# Patient Record
Sex: Female | Born: 1990
Health system: Southern US, Community
[De-identification: ages and names within clinical notes are randomized; demographics above are authoritative.]

## PROBLEM LIST (undated history)

## (undated) ENCOUNTER — Inpatient Hospital Stay (HOSPITAL_COMMUNITY): Payer: Self-pay

## (undated) DIAGNOSIS — F329 Major depressive disorder, single episode, unspecified: Secondary | ICD-10-CM

## (undated) DIAGNOSIS — F909 Attention-deficit hyperactivity disorder, unspecified type: Secondary | ICD-10-CM

## (undated) DIAGNOSIS — F419 Anxiety disorder, unspecified: Secondary | ICD-10-CM

## (undated) DIAGNOSIS — F32A Depression, unspecified: Secondary | ICD-10-CM

## (undated) DIAGNOSIS — D649 Anemia, unspecified: Secondary | ICD-10-CM

## (undated) HISTORY — DX: Anxiety disorder, unspecified: F41.9

## (undated) HISTORY — PX: LEEP: SHX91

## (undated) HISTORY — DX: Anemia, unspecified: D64.9

## (undated) HISTORY — DX: Depression, unspecified: F32.A

## (undated) HISTORY — DX: Major depressive disorder, single episode, unspecified: F32.9

---

## 2014-03-08 ENCOUNTER — Ambulatory Visit (INDEPENDENT_AMBULATORY_CARE_PROVIDER_SITE_OTHER): Payer: BC Managed Care – PPO | Admitting: Physician Assistant

## 2014-03-08 VITALS — BP 110/64 | HR 115 | Temp 100.5°F | Resp 18 | Ht 64.5 in | Wt 110.0 lb

## 2014-03-08 DIAGNOSIS — J029 Acute pharyngitis, unspecified: Secondary | ICD-10-CM

## 2014-03-08 DIAGNOSIS — R509 Fever, unspecified: Secondary | ICD-10-CM

## 2014-03-08 DIAGNOSIS — J020 Streptococcal pharyngitis: Secondary | ICD-10-CM

## 2014-03-08 LAB — POCT RAPID STREP A (OFFICE): Rapid Strep A Screen: POSITIVE — AB

## 2014-03-08 LAB — POCT CBC
Granulocyte percent: 81.1 %G — AB (ref 37–80)
HCT, POC: 30.6 % — AB (ref 37.7–47.9)
Hemoglobin: 9.4 g/dL — AB (ref 12.2–16.2)
Lymph, poc: 1.5 (ref 0.6–3.4)
MCH, POC: 24.6 pg — AB (ref 27–31.2)
MCHC: 30.7 g/dL — AB (ref 31.8–35.4)
MCV: 80.2 fL (ref 80–97)
MID (cbc): 0.9 (ref 0–0.9)
MPV: 8.9 fL (ref 0–99.8)
POC Granulocyte: 10.3 — AB (ref 2–6.9)
POC LYMPH PERCENT: 11.8 %L (ref 10–50)
POC MID %: 7 %M (ref 0–12)
Platelet Count, POC: 283 10*3/uL (ref 142–424)
RBC: 3.82 M/uL — AB (ref 4.04–5.48)
RDW, POC: 18.1 %
WBC: 12.7 10*3/uL — AB (ref 4.6–10.2)

## 2014-03-08 MED ORDER — HYDROCODONE-ACETAMINOPHEN 7.5-325 MG/15ML PO SOLN: 10.0000 mL | Freq: Three times a day (TID) | ORAL | Status: DC | PRN

## 2014-03-08 MED ORDER — AZITHROMYCIN 500 MG PO TABS: 500.0000 mg | ORAL_TABLET | Freq: Every day | ORAL | Status: DC

## 2014-03-08 MED ORDER — IBUPROFEN 800 MG PO TABS: 800.0000 mg | ORAL_TABLET | Freq: Three times a day (TID) | ORAL | Status: DC | PRN

## 2014-03-08 NOTE — Patient Instructions (Signed)
Take second dose of azithromycin 250 mg tonight. Starting tomorrow take 2 pills x 1 day. Pick up prescription for 500 mg tablets and take 1 daily x 2 days.  Take ibuprofen 800 mg three times daily as needed for fever and pain Lortab elixir every 8 hours as needed for pain - caution sedation Increase fluids and rest Recheck tomorrow afternoon before 3:00 p.m

## 2014-03-08 NOTE — Progress Notes (Signed)
Subjective:    Patient ID: Misty SpeckingChristelle Kldibu, female    DOB: 02/12/1991, 23 y.o.   MRN: [id number]  HPI 23 year old female presents for evaluation of sore throat x 5 days. States symptoms started suddenly and have continued to worsen. She has had fever (max T: 101), sore throat, painful swallowing, headache, body aches, and decreased appetite.  She is a Consulting civil engineerstudent at [employer] and she went to her student health center yesterday and was prescribed a Zpack. Reports that they checked her for strep, flu, and mono and all were negative.  Has taken the first 2 days of the Zpack and feels worse.  She has taken ibuprofen 400 mg which does help temporarily with the pain and fever. She has been unable to eat anything secondary to painful swallowing.  No known hx of strep.  Patient is otherwise healthy with no other concerns today.  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      Review of Systems  Constitutional: Positive for fever, chills and fatigue.  HENT: Positive for sore throat and trouble swallowing (secondary to pain). Negative for congestion, postnasal drip, rhinorrhea and sinus pressure.   Respiratory: Negative for cough, shortness of breath and wheezing.   Gastrointestinal: Positive for nausea. Negative for vomiting and abdominal pain.  Neurological: Positive for headaches. Negative for dizziness.       Objective:   Physical Exam  Constitutional: She is oriented to person, place, and time. She appears well-developed and well-nourished.  HENT:  Head: Normocephalic and atraumatic.  Right Ear: Hearing, tympanic membrane, external ear and ear canal normal.  Left Ear: Hearing, tympanic membrane, external ear and ear canal normal.  Mouth/Throat: Uvula is midline and mucous membranes are normal. Posterior oropharyngeal erythema (3+ tonsillar swelling) present. No oropharyngeal exudate, posterior oropharyngeal edema or tonsillar abscesses.  Eyes: Conjunctivae are normal.  Neck:  Normal range of motion. Neck supple.  Cardiovascular: Normal rate, regular rhythm and normal heart sounds.   Pulmonary/Chest: Effort normal and breath sounds normal.  Lymphadenopathy:    She has cervical adenopathy.  Neurological: She is alert and oriented to person, place, and time.  Psychiatric: She has a normal mood and affect. Her behavior is normal. Judgment and thought content normal.     Results for orders placed in visit on 03/08/14  POCT CBC      Result Value Ref Range   WBC 12.7 (*) 4.6 - 10.2 K/uL   Lymph, poc 1.5  0.6 - 3.4   POC LYMPH PERCENT 11.8  10 - 50 %L   MID (cbc) 0.9  0 - 0.9   POC MID % 7.0  0 - 12 %M   POC Granulocyte 10.3 (*) 2 - 6.9   Granulocyte percent 81.1 (*) 37 - 80 %G   RBC 3.82 (*) 4.04 - 5.48 M/uL   Hemoglobin 9.4 (*) 12.2 - 16.2 g/dL   HCT, POC 32.430.6 (*) 40.137.7 - 47.9 %   MCV 80.2  80 - 97 fL   MCH, POC 24.6 (*) 27 - 31.2 pg   MCHC 30.7 (*) 31.8 - 35.4 g/dL   RDW, POC 02.718.1     Platelet Count, POC 283  142 - 424 K/uL   MPV 8.9  0 - 99.8 fL  POCT RAPID STREP A (OFFICE)      Result Value Ref Range   Rapid Strep A Screen Positive (*) Negative        Assessment & Plan:  Strep pharyngitis - Plan: azithromycin (ZITHROMAX) 500 MG tablet  Acute pharyngitis - Plan: POCT CBC, POCT rapid strep A, Epstein-Barr virus VCA antibody panel, ibuprofen (ADVIL,MOTRIN) 800 MG tablet, HYDROcodone-acetaminophen (HYCET) 7.5-325 mg/15 ml solution  Fever, unspecified  Increase Zpack to 500 mg daily x 5 days. Instructed her to take another dose of her 250 mg tablets tonight, then take 2 tablets tomorrow. I have provided a new rx for zithromax 500 mg to complete her 5 day course.  Lortab elixier q8hours prn pain. Rx for ibuprofen 800 mg tid prn pain.  Push fluids and try to eat once pain is controlled Recheck tomorrow afternoon. If still no improvement may consider change to clinda.  To ER if acutely worse in the night.

## 2014-03-09 ENCOUNTER — Encounter: Payer: Self-pay | Admitting: Physician Assistant

## 2014-03-09 ENCOUNTER — Ambulatory Visit (INDEPENDENT_AMBULATORY_CARE_PROVIDER_SITE_OTHER): Payer: BC Managed Care – PPO | Admitting: Physician Assistant

## 2014-03-09 VITALS — BP 118/64 | HR 99 | Temp 98.0°F | Resp 17 | Ht 64.5 in | Wt 109.0 lb

## 2014-03-09 DIAGNOSIS — B370 Candidal stomatitis: Secondary | ICD-10-CM

## 2014-03-09 DIAGNOSIS — J020 Streptococcal pharyngitis: Secondary | ICD-10-CM

## 2014-03-09 DIAGNOSIS — J029 Acute pharyngitis, unspecified: Secondary | ICD-10-CM

## 2014-03-09 LAB — POCT CBC
Granulocyte percent: 79.4 %G (ref 37–80)
HCT, POC: 33.2 % — AB (ref 37.7–47.9)
Hemoglobin: 10 g/dL — AB (ref 12.2–16.2)
Lymph, poc: 1.4 (ref 0.6–3.4)
MCH, POC: 24.7 pg — AB (ref 27–31.2)
MCHC: 30.1 g/dL — AB (ref 31.8–35.4)
MCV: 81.9 fL (ref 80–97)
MID (cbc): 0.7 (ref 0–0.9)
MPV: 9.5 fL (ref 0–99.8)
POC Granulocyte: 7.9 — AB (ref 2–6.9)
POC LYMPH PERCENT: 13.7 %L (ref 10–50)
POC MID %: 6.9 %M (ref 0–12)
Platelet Count, POC: 334 10*3/uL (ref 142–424)
RBC: 4.05 M/uL (ref 4.04–5.48)
RDW, POC: 17.9 %
WBC: 9.9 10*3/uL (ref 4.6–10.2)

## 2014-03-09 LAB — POCT SKIN KOH: Skin KOH, POC: POSITIVE

## 2014-03-09 MED ORDER — CLOTRIMAZOLE 10 MG MT TROC: 10.0000 mg | Freq: Every day | OROMUCOSAL | Status: DC

## 2014-03-09 NOTE — Progress Notes (Signed)
   Subjective:    Patient ID: Misty Ayers, female    DOB: 12/10/1990, 23 y.o.   MRN: [id number]  HPI 23 year old female presents for recheck of strep throat. Was seen last night and had + rapid strep and WBC count of 12.7. Was having fever up to 101 and a difficult time swallowing. Today reports significant improvement in symptoms. Fever has broken and pain has improved. She has been able to eat and drink normally today.   Complains also of pain on her tongue and has noticed "white stuff" on the surface when she woke up this morning.   She is otherwise doing much better today.     Review of Systems  Constitutional: Negative for fever and chills.  HENT: Positive for sore throat. Negative for congestion and trouble swallowing.   Respiratory: Negative for cough.   Neurological: Negative for headaches.       Objective:   Physical Exam  Constitutional: She is oriented to person, place, and time. She appears well-developed and well-nourished.  HENT:  Head: Normocephalic and atraumatic.  Right Ear: Hearing, tympanic membrane, external ear and ear canal normal.  Left Ear: Hearing, tympanic membrane, external ear and ear canal normal.  Mouth/Throat: Uvula is midline. Posterior oropharyngeal erythema (2+ tonsillar swelling) present. No oropharyngeal exudate, posterior oropharyngeal edema or tonsillar abscesses.  There is a thin white film on tongue  Eyes: Conjunctivae are normal.  Neck: Normal range of motion.  Cardiovascular: Normal rate, regular rhythm and normal heart sounds.   Pulmonary/Chest: Effort normal and breath sounds normal.  Neurological: She is alert and oriented to person, place, and time.  Psychiatric: She has a normal mood and affect. Her behavior is normal. Judgment and thought content normal.      Results for orders placed in visit on 03/09/14  POCT SKIN KOH      Result Value Ref Range   Skin KOH, POC Positive    POCT CBC      Result Value Ref Range   WBC 9.9   4.6 - 10.2 K/uL   Lymph, poc 1.4  0.6 - 3.4   POC LYMPH PERCENT 13.7  10 - 50 %L   MID (cbc) 0.7  0 - 0.9   POC MID % 6.9  0 - 12 %M   POC Granulocyte 7.9 (*) 2 - 6.9   Granulocyte percent 79.4  37 - 80 %G   RBC 4.05  4.04 - 5.48 M/uL   Hemoglobin 10.0 (*) 12.2 - 16.2 g/dL   HCT, POC 40.933.2 (*) 81.137.7 - 47.9 %   MCV 81.9  80 - 97 fL   MCH, POC 24.7 (*) 27 - 31.2 pg   MCHC 30.1 (*) 31.8 - 35.4 g/dL   RDW, POC 91.417.9     Platelet Count, POC 334  142 - 424 K/uL   MPV 9.5  0 - 99.8 fL       Assessment & Plan:  Strep pharyngitis - Plan: POCT CBC  Acute pharyngitis - Plan: POCT Skin KOH  Oral thrush - Plan: clotrimazole (MYCELEX) 10 MG troche  Continue treatment of strep as directed. May take lortab elixir and ibuprofen 800 mg as prescribed until pain resolved.  Treat oral thrush with clotrimazole troche's 5x/day x 2 weeks RTC precautions discussed. Change toothbrush Follow up if symptoms worsening or fail to improve.

## 2014-03-10 LAB — EPSTEIN-BARR VIRUS VCA ANTIBODY PANEL
EBV EA IgG: <5 U/mL (ref <9.0)
EBV NA IgG: 518 U/mL — ABNORMAL HIGH (ref <18.0)
EBV VCA IgG: 341 U/mL — ABNORMAL HIGH (ref <18.0)
EBV VCA IgM: <10 U/mL (ref <36.0)

## 2014-03-13 NOTE — Progress Notes (Addendum)
Discussed with Misty Ayers recently had HIV testing and declined repeat testing at her current visit.  She was encouraged to have repeat testing soon

## 2014-03-20 ENCOUNTER — Encounter: Payer: Self-pay | Admitting: *Deleted

## 2014-06-02 ENCOUNTER — Emergency Department (HOSPITAL_COMMUNITY)
Admission: EM | Admit: 2014-06-02 | Discharge: 2014-06-02 | Disposition: A | Discharge status: Home / Self Care | Payer: BC Managed Care – PPO | Attending: Emergency Medicine | Admitting: Emergency Medicine

## 2014-06-02 ENCOUNTER — Emergency Department (HOSPITAL_COMMUNITY): Payer: BC Managed Care – PPO

## 2014-06-02 ENCOUNTER — Encounter (HOSPITAL_COMMUNITY): Payer: Self-pay | Admitting: Emergency Medicine

## 2014-06-02 DIAGNOSIS — Y929 Unspecified place or not applicable: Secondary | ICD-10-CM | POA: Insufficient documentation

## 2014-06-02 DIAGNOSIS — S139XXA Sprain of joints and ligaments of unspecified parts of neck, initial encounter: Secondary | ICD-10-CM | POA: Insufficient documentation

## 2014-06-02 DIAGNOSIS — Z862 Personal history of diseases of the blood and blood-forming organs and certain disorders involving the immune mechanism: Secondary | ICD-10-CM | POA: Insufficient documentation

## 2014-06-02 DIAGNOSIS — S060X1A Concussion with loss of consciousness of 30 minutes or less, initial encounter: Secondary | ICD-10-CM | POA: Insufficient documentation

## 2014-06-02 DIAGNOSIS — X58XXXA Exposure to other specified factors, initial encounter: Secondary | ICD-10-CM | POA: Insufficient documentation

## 2014-06-02 DIAGNOSIS — Z8659 Personal history of other mental and behavioral disorders: Secondary | ICD-10-CM | POA: Insufficient documentation

## 2014-06-02 DIAGNOSIS — Y9389 Activity, other specified: Secondary | ICD-10-CM | POA: Insufficient documentation

## 2014-06-02 DIAGNOSIS — S161XXA Strain of muscle, fascia and tendon at neck level, initial encounter: Secondary | ICD-10-CM

## 2014-06-02 DIAGNOSIS — Z880 Allergy status to penicillin: Secondary | ICD-10-CM | POA: Insufficient documentation

## 2014-06-02 DIAGNOSIS — Z791 Long term (current) use of non-steroidal anti-inflammatories (NSAID): Secondary | ICD-10-CM | POA: Insufficient documentation

## 2014-06-02 MED ORDER — HYDROCODONE-ACETAMINOPHEN 5-325 MG PO TABS
2.0000 | ORAL_TABLET | Freq: Once | ORAL | Status: AC
  Administered 2014-06-02: 2 via ORAL
  Filled 2014-06-02: qty 2

## 2014-06-02 MED ORDER — IBUPROFEN 600 MG PO TABS: 600.0000 mg | ORAL_TABLET | Freq: Four times a day (QID) | ORAL | Status: DC | PRN

## 2014-06-02 MED ORDER — METOCLOPRAMIDE HCL 10 MG PO TABS
10.0000 mg | ORAL_TABLET | Freq: Once | ORAL | Status: AC
  Administered 2014-06-02: 10 mg via ORAL
  Filled 2014-06-02 (×2): qty 1

## 2014-06-02 MED ORDER — DIAZEPAM 5 MG PO TABS
5.0000 mg | ORAL_TABLET | Freq: Once | ORAL | Status: AC
  Administered 2014-06-02: 5 mg via ORAL
  Filled 2014-06-02: qty 1

## 2014-06-02 MED ORDER — HYDROCODONE-ACETAMINOPHEN 5-325 MG PO TABS: 1.0000 | ORAL_TABLET | Freq: Four times a day (QID) | ORAL | Status: DC | PRN

## 2014-06-02 MED ORDER — METOCLOPRAMIDE HCL 10 MG PO TABS: 10.0000 mg | ORAL_TABLET | Freq: Four times a day (QID) | ORAL | Status: DC | PRN

## 2014-06-02 MED ORDER — CYCLOBENZAPRINE HCL 10 MG PO TABS: 10.0000 mg | ORAL_TABLET | Freq: Two times a day (BID) | ORAL | Status: DC | PRN

## 2014-06-02 MED ORDER — IBUPROFEN 800 MG PO TABS
800.0000 mg | ORAL_TABLET | Freq: Once | ORAL | Status: AC
  Administered 2014-06-02: 800 mg via ORAL
  Filled 2014-06-02: qty 1

## 2014-06-02 NOTE — ED Notes (Signed)
Presents with headache, neck pain post slip in slide accident from yesterday where head hit ground and bounced up and hit again. Pt states she lost consciousness. Reports nausea and dizziness today. Alert, oriented and MAE x4. c-collar placed

## 2014-06-02 NOTE — ED Notes (Signed)
All belongings given to the pt at dc.

## 2014-06-02 NOTE — Discharge Instructions (Signed)
Stay hydrated.   You have concussion. No contact sports until you feel better.   Take reglan for nausea and headaches.   Take motrin for pain.   Take vicodin for severe pain. Do NOT drive with it.   Take flexeril for muscle spasms.   Follow up with your doctor.   Return to ER if you have severe headache, neck pain, vomiting.

## 2014-06-02 NOTE — ED Provider Notes (Signed)
CSN: [id number]     Arrival date & time 06/02/14  1819 History   First MD Initiated Contact with Patient 06/02/14 2003     Chief Complaint  Patient presents with  . Head Injury     (Consider location/radiation/quality/duration/timing/severity/associated sxs/prior Treatment) The history is provided by the patient.  Misty Ayers is a 23 y.o. female here with headache, neck pain. Was going down a slide yesterday and accidentally hit her head on the ground and hit her neck. Has brief LOC. Has nausea and headache and dizziness. Denies vomiting or numbness or weakness. Denies other injuries.    Past Medical History  Diagnosis Date  . Anemia   . Anxiety    History reviewed. No pertinent past surgical history. Family History  Problem Relation Age of Onset  . Stroke Mother   . Stroke Brother    History  Substance Use Topics  . Smoking status: Never Smoker   . Smokeless tobacco: Not on file  . Alcohol Use: No   OB History   Grav Para Term Preterm Abortions TAB SAB Ect Mult Living                 Review of Systems  HENT:       Neck pain   Neurological: Positive for headaches.  All other systems reviewed and are negative.     Allergies  Penicillins  Home Medications   Prior to Admission medications   Medication Sig Start Date End Date Taking? Authorizing Provider  ibuprofen (ADVIL,MOTRIN) 800 MG tablet Take 800 mg by mouth every 8 (eight) hours as needed for moderate pain. 03/08/14  Yes Heather M Marte, PA-C  azithromycin (ZITHROMAX) 500 MG tablet Take 1 tablet (500 mg total) by mouth daily. 03/08/14   Heather M Marte, PA-C   BP 122/75  Pulse 72  Temp(Src) 98 F (36.7 C) (Oral)  Resp 18  SpO2 100%  LMP 05/29/2014 Physical Exam  Nursing note and vitals reviewed. Constitutional: She is oriented to person, place, and time. She appears well-developed and well-nourished.  HENT:  Head: Normocephalic.  Mild tenderness posteriorly. No obvious hematoma.   Eyes:  Conjunctivae are normal. Pupils are equal, round, and reactive to light.  Neck:  + R paracervical tenderness. ? Midline tenderness   Cardiovascular: Normal rate, regular rhythm and normal heart sounds.   Pulmonary/Chest: Effort normal and breath sounds normal. No respiratory distress. She has no wheezes.  Abdominal: Soft. Bowel sounds are normal. She exhibits no distension. There is no tenderness. There is no rebound.  Musculoskeletal: Normal range of motion. She exhibits no edema and no tenderness.  No obvious extremity trauma   Neurological: She is alert and oriented to person, place, and time. No cranial nerve deficit. Coordination normal.  Nl strength throughout   Skin: Skin is warm and dry.  Psychiatric: She has a normal mood and affect. Her behavior is normal. Judgment and thought content normal.    ED Course  Procedures (including critical care time) Labs Review Labs Reviewed - No data to display  Imaging Review Dg Cervical Spine Complete  06/02/2014   CLINICAL DATA:  Head injury, headache and dizziness. Right-sided neck pain.  EXAM: CERVICAL SPINE  4+ VIEWS  COMPARISON:  None.  FINDINGS: The cervical spine is visualized from the occiput to the cervicothoracic junction. There is reversal of the normal cervical lordosis. Patient is in a cervical stabilization collar. Neural foramina are patent. Dens is obscured on dedicated views. Visualized lung apices are clear.  IMPRESSION: Reversal of the normal cervical lordosis without subluxation or fracture.   Electronically Signed   By: Leanna BattlesMelinda  Blietz M.D.   On: 06/02/2014 19:42   Ct Head Wo Contrast  06/02/2014   CLINICAL DATA:  23 year old female status post fall, twisting injury, head injury. Headache and lightheadedness. Initial encounter.  EXAM: CT HEAD WITHOUT CONTRAST  TECHNIQUE: Contiguous axial images were obtained from the base of the skull through the vertex without intravenous contrast.  COMPARISON:  Cervical spine radiographs from  same day.  FINDINGS: Visible paranasal sinuses and mastoids are clear except for minor ethmoid mucosal thickening on the right. No acute osseous abnormality identified. Visualized orbits and scalp soft tissues are within normal limits.  Cerebral volume is normal. No midline shift, ventriculomegaly, mass effect, evidence of mass lesion, intracranial hemorrhage or evidence of cortically based acute infarction. Gray-white matter differentiation is within normal limits throughout the brain. No suspicious intracranial vascular hyperdensity.  IMPRESSION: Normal noncontrast CT appearance of the brain.   Electronically Signed   By: Augusto GambleLee  Hall M.D.   On: 06/02/2014 20:18     EKG Interpretation None      MDM   Final diagnoses:  None   Misty Ayers is a 23 y.o. female here with head injury. Likely concussion. CT head unremarkable. Neck xray showed no fracture, likely muscle spasms. Will give meds for symptomatic management.      Richardean Canalavid H Wafa Martes, MD 06/02/14 2155

## 2015-09-28 ENCOUNTER — Ambulatory Visit (INDEPENDENT_AMBULATORY_CARE_PROVIDER_SITE_OTHER): Payer: BLUE CROSS/BLUE SHIELD | Admitting: Family Medicine

## 2015-09-28 VITALS — BP 122/78 | HR 85 | Temp 98.6°F | Resp 17 | Ht 63.5 in | Wt 111.0 lb

## 2015-09-28 DIAGNOSIS — Z111 Encounter for screening for respiratory tuberculosis: Secondary | ICD-10-CM

## 2015-09-28 DIAGNOSIS — Z131 Encounter for screening for diabetes mellitus: Secondary | ICD-10-CM

## 2015-09-28 DIAGNOSIS — Z13220 Encounter for screening for lipoid disorders: Secondary | ICD-10-CM

## 2015-09-28 DIAGNOSIS — Z0184 Encounter for antibody response examination: Secondary | ICD-10-CM | POA: Diagnosis not present

## 2015-09-28 DIAGNOSIS — Z130 Encounter for screening for diseases of the blood and blood-forming organs and certain disorders involving the immune mechanism: Secondary | ICD-10-CM

## 2015-09-28 DIAGNOSIS — Z0000 Encounter for general adult medical examination without abnormal findings: Secondary | ICD-10-CM | POA: Diagnosis not present

## 2015-09-28 DIAGNOSIS — Z23 Encounter for immunization: Secondary | ICD-10-CM

## 2015-09-28 LAB — COMPREHENSIVE METABOLIC PANEL
ALT: 11 U/L (ref 6–29)
AST: 21 U/L (ref 10–30)
Albumin: 4.5 g/dL (ref 3.6–5.1)
Alkaline Phosphatase: 56 U/L (ref 33–115)
BUN: 10 mg/dL (ref 7–25)
CHLORIDE: 104 mmol/L (ref 98–110)
CO2: 27 mmol/L (ref 20–31)
CREATININE: 0.86 mg/dL (ref 0.50–1.10)
Calcium: 9.7 mg/dL (ref 8.6–10.2)
Glucose, Bld: 85 mg/dL (ref 65–99)
Potassium: 4.1 mmol/L (ref 3.5–5.3)
SODIUM: 139 mmol/L (ref 135–146)
Total Bilirubin: 0.4 mg/dL (ref 0.2–1.2)
Total Protein: 7.9 g/dL (ref 6.1–8.1)

## 2015-09-28 LAB — POCT URINALYSIS DIP (MANUAL ENTRY)
BILIRUBIN UA: NEGATIVE
Glucose, UA: NEGATIVE
Leukocytes, UA: NEGATIVE
NITRITE UA: NEGATIVE
PH UA: 5.5
Protein Ur, POC: NEGATIVE
Spec Grav, UA: >=1.03
Urobilinogen, UA: 0.2

## 2015-09-28 LAB — HEPATITIS B SURFACE ANTIBODY, QUANTITATIVE: HEPATITIS B-POST: 993 m[IU]/mL

## 2015-09-28 LAB — CBC
HCT: 36.2 % (ref 36.0–46.0)
Hemoglobin: 11.4 g/dL — ABNORMAL LOW (ref 12.0–15.0)
MCH: 24.9 pg — ABNORMAL LOW (ref 26.0–34.0)
MCHC: 31.5 g/dL (ref 30.0–36.0)
MCV: 79 fL (ref 78.0–100.0)
MPV: 10.5 fL (ref 8.6–12.4)
Platelets: 284 10*3/uL (ref 150–400)
RBC: 4.58 MIL/uL (ref 3.87–5.11)
RDW: 18.1 % — AB (ref 11.5–15.5)
WBC: 5.6 10*3/uL (ref 4.0–10.5)

## 2015-09-28 LAB — LIPID PANEL
Cholesterol: 181 mg/dL (ref 125–200)
HDL: 62 mg/dL (ref >=46)
LDL CALC: 96 mg/dL (ref <130)
Total CHOL/HDL Ratio: 2.9 Ratio (ref <=5.0)
Triglycerides: 113 mg/dL (ref <150)
VLDL: 23 mg/dL (ref <30)

## 2015-09-28 NOTE — Patient Instructions (Signed)
It was good to see you today!  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

## 2015-09-28 NOTE — Progress Notes (Addendum)
[facility] [address], [place] [address] [phone]  Date:  09/28/2015   Name:  Misty Ayers   DOB:  [birth year]   MRN:  [id number]  PCP:  No PCP Per Patient    Chief Complaint: nursing physical and Immunizations   History of Present Illness:  Misty Ayers is a 24 y.o. 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 She is just starting this program and needs a physical and some titers/ shorts She has her menses right now and does not want to do a pap today- never had an abnormal, last was about 18 months ago Generally in good health- she was born in [place] and had BCG, has had positive PPD so would like quantiferon gold for required TB screening She has a history of anxiety which is under control, and also ADHD for which she uses mediation  She does state that over the last few months she has noted a few episodes of racing heart and chest pain. These occur at rest and generally when she is worried about something. She feels fine now. She exercises several times a week and does not have any CP when exercising Offered a cardiology referral and or EKG but she declines at this time  There are no active problems to display for this patient.   Past Medical History  Diagnosis Date  . Anemia   . Anxiety   . Depression     History reviewed. No pertinent past surgical history.  Social History  Substance Use Topics  . Smoking status: Never Smoker   . Smokeless tobacco: None  . Alcohol Use: No    Family History  Problem Relation Age of Onset  . Stroke Mother   . Stroke Brother     Allergies  Allergen Reactions  . Penicillins     unknown    Medication list has been reviewed and updated.  Current Outpatient Prescriptions on File Prior to Visit  Medication Sig Dispense Refill  . ibuprofen (ADVIL,MOTRIN) 600 MG tablet Take 1 tablet (600 mg total) by mouth every  6 (six) hours as needed. 30 tablet 0  . ibuprofen (ADVIL,MOTRIN) 800 MG tablet Take 800 mg by mouth every 8 (eight) hours as needed for moderate pain.     No current facility-administered medications on file prior to visit.    Review of Systems:  As per HPI- otherwise negative.   Physical Examination: Filed Vitals:   09/28/15 0953  BP: 122/78  Pulse: 85  Temp: 98.6 F (37 C)  Resp: 17   Filed Vitals:   09/28/15 0953  Height: 5' 3.5" (1.613 m)  Weight: 111 lb (50.349 kg)   Body mass index is 19.35 kg/(m^2). Ideal Body Weight: Weight in (lb) to have BMI = 25: 143.1  GEN: WDWN, NAD, Non-toxic, A & O x 3 HEENT: Atraumatic, Normocephalic. Neck supple. No masses, No LAD. Ears and Nose: No external deformity. CV: RRR, No M/G/R. No JVD. No thrill. No extra heart sounds. PULM: CTA B, no wheezes, crackles, rhonchi. No retractions. No resp. distress. No accessory muscle use. ABD: S, NT, ND, +BS. No rebound. No HSM. EXTR: No c/c/e NEURO Normal gait.  PSYCH: Normally interactive. Conversant. Not depressed or anxious appearing.  Calm demeanor.   Results for orders placed or performed in visit on 09/28/15  POCT urinalysis dipstick  Result Value Ref Range   Color, UA  yellow yellow   Clarity, UA clear clear   Glucose, UA negative negative   Bilirubin, UA negative negative   Ketones, POC UA trace (5) (A) negative   Spec Grav, UA >=1.030    Blood, UA large (A) negative   pH, UA 5.5    Protein Ur, POC negative negative   Urobilinogen, UA 0.2    Nitrite, UA Negative Negative   Leukocytes, UA Negative Negative   She is menstrating currently Assessment and Plan: Physical exam - Plan: POCT urinalysis dipstick  Need for prophylactic vaccination and inoculation against influenza - Plan: Flu Vaccine QUAD 36+ mos IM, Tdap vaccine greater than or equal to 7yo IM  Screening for tuberculosis - Plan: Quantiferon tb gold assay  Immunity status testing - Plan: Measles/Mumps/Rubella  Immunity, Varicella zoster antibody, IgG, Hepatitis B surface antibody  Screening for deficiency anemia - Plan: CBC  Screening for hyperlipidemia - Plan: Lipid panel  Screening for diabetes mellitus - Plan: Comprehensive metabolic panel  CPE and labs as above Given tdap and flu shots today She will seek care if her CP does not resolve  Signed Lamar Blinks, MD  Received her labs 10/24- everything looks fine except she is not immune to rubeola. She will need to get another MMR booster.  She plans to come by and do this soon, and will also pick up a copy of all her labs.    Please give an MMR when she comes back to clinic.  Results for orders placed or performed in visit on 09/28/15  Measles/Mumps/Rubella Immunity  Result Value Ref Range   Rubella 21.60 (H) <0.90 Index   Mumps IgG >300.00 (H) <9.00 AU/mL   Rubeola IgG <5.00 <25.00 AU/mL  Varicella zoster antibody, IgG  Result Value Ref Range   Varicella IgG 2354.00 (H) <135.00 Index  Hepatitis B surface antibody  Result Value Ref Range   Hepatitis B-Post 993.0 mIU/mL  Quantiferon tb gold assay  Result Value Ref Range   Interferon Gamma Release Assay NEGATIVE NEGATIVE   TB Ag value 0.08 IU/mL   Quantiferon Nil Value 0.07 IU/mL   Mitogen value 9.27 IU/mL   Quantiferon Tb Ag Minus Nil Value 0.01 IU/mL  CBC  Result Value Ref Range   WBC 5.6 4.0 - 10.5 K/uL   RBC 4.58 3.87 - 5.11 MIL/uL   Hemoglobin 11.4 (L) 12.0 - 15.0 g/dL   HCT 36.2 36.0 - 46.0 %   MCV 79.0 78.0 - 100.0 fL   MCH 24.9 (L) 26.0 - 34.0 pg   MCHC 31.5 30.0 - 36.0 g/dL   RDW 18.1 (H) 11.5 - 15.5 %   Platelets 284 150 - 400 K/uL   MPV 10.5 8.6 - 12.4 fL  Comprehensive metabolic panel  Result Value Ref Range   Sodium 139 135 - 146 mmol/L   Potassium 4.1 3.5 - 5.3 mmol/L   Chloride 104 98 - 110 mmol/L   CO2 27 20 - 31 mmol/L   Glucose, Bld 85 65 - 99 mg/dL   BUN 10 7 - 25 mg/dL   Creat 0.86 0.50 - 1.10 mg/dL   Total Bilirubin 0.4 0.2 - 1.2 mg/dL    Alkaline Phosphatase 56 33 - 115 U/L   AST 21 10 - 30 U/L   ALT 11 6 - 29 U/L   Total Protein 7.9 6.1 - 8.1 g/dL   Albumin 4.5 3.6 - 5.1 g/dL   Calcium 9.7 8.6 - 10.2 mg/dL  Lipid panel  Result Value Ref Range  Cholesterol 181 125 - 200 mg/dL   Triglycerides 113 <150 mg/dL   HDL 62 >=46 mg/dL   Total CHOL/HDL Ratio 2.9 <=5.0 Ratio   VLDL 23 <30 mg/dL   LDL Cholesterol 96 <130 mg/dL  POCT urinalysis dipstick  Result Value Ref Range   Color, UA yellow yellow   Clarity, UA clear clear   Glucose, UA negative negative   Bilirubin, UA negative negative   Ketones, POC UA trace (5) (A) negative   Spec Grav, UA >=1.030    Blood, UA large (A) negative   pH, UA 5.5    Protein Ur, POC negative negative   Urobilinogen, UA 0.2    Nitrite, UA Negative Negative   Leukocytes, UA Negative Negative

## 2015-09-29 LAB — QUANTIFERON TB GOLD ASSAY (BLOOD)
Interferon Gamma Release Assay: NEGATIVE
Mitogen value: 9.27 IU/mL
Quantiferon Nil Value: 0.07 IU/mL
Quantiferon Tb Ag Minus Nil Value: 0.01 IU/mL
TB Ag value: 0.08 IU/mL

## 2015-10-01 ENCOUNTER — Encounter: Payer: Self-pay | Admitting: Family Medicine

## 2015-10-01 LAB — VARICELLA ZOSTER ANTIBODY, IGG: Varicella IgG: 2354 Index — ABNORMAL HIGH (ref <135.00)

## 2015-10-01 LAB — MEASLES/MUMPS/RUBELLA IMMUNITY: RUBELLA: 21.6 {index} — AB (ref <0.90)

## 2015-10-12 ENCOUNTER — Ambulatory Visit (INDEPENDENT_AMBULATORY_CARE_PROVIDER_SITE_OTHER): Payer: BLUE CROSS/BLUE SHIELD | Admitting: Family Medicine

## 2015-10-12 DIAGNOSIS — Z23 Encounter for immunization: Secondary | ICD-10-CM

## 2016-03-19 ENCOUNTER — Ambulatory Visit (INDEPENDENT_AMBULATORY_CARE_PROVIDER_SITE_OTHER): Payer: BLUE CROSS/BLUE SHIELD | Admitting: Family Medicine

## 2016-03-19 VITALS — BP 117/74 | HR 83 | Temp 98.0°F | Resp 17 | Ht 63.5 in | Wt 115.0 lb

## 2016-03-19 DIAGNOSIS — G44219 Episodic tension-type headache, not intractable: Secondary | ICD-10-CM

## 2016-03-19 MED ORDER — NAPROXEN 500 MG PO TABS: 500.0000 mg | ORAL_TABLET | Freq: Two times a day (BID) | ORAL | Status: DC | PRN

## 2016-03-19 MED ORDER — CYCLOBENZAPRINE HCL 10 MG PO TABS: 10.0000 mg | ORAL_TABLET | Freq: Three times a day (TID) | ORAL | Status: DC | PRN

## 2016-03-19 NOTE — Progress Notes (Signed)
Subjective:    Patient ID: Misty Ayers, female    DOB: 10/27/1991, 25 y.o.   MRN: [id number]  HPI This is a pleasant 25 yo female who presents today with 7 days of intermittent headache. Headache starts at back of head and moves to the front. Little relief with ibuprofen 600 mg. She took ibuprofen last night and noticed swelling of eyes, mouth. Was not sure if this was related to pollen sensitivity. Took benadryl 75 mg last night with some relief. Has had similar symptoms in the past, but not this severe. No light sensitivity, some sensitivity to noise. Currently pain at occipital area and behind eyes. Pain 5/10 currently. Has reached 9-10 x 2 in past 7 days. 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 She will graduate next May. No fever, no neck pain, some stiffness in trapezius muscles. No visual changes. No nausea, no vomiting, no abdominal pain. Wakes up with slight headache and it gets worse throughout the day. Has had decreased sleep with headache.   Has history of concussion 6/15. Had full resolution of symptoms.   Past Medical History  Diagnosis Date  . Anemia   . Anxiety   . Depression    No past surgical history on file. Family History  Problem Relation Age of Onset  . Stroke Mother   . Stroke Brother    Social History  Substance Use Topics  . Smoking status: Never Smoker   . Smokeless tobacco: None  . Alcohol Use: No      Review of Systems  Constitutional: Negative for fever.  HENT: Negative for congestion, ear pain, rhinorrhea and sore throat.   Eyes: Positive for pain (headache behind eyes). Negative for photophobia, discharge and visual disturbance.  Respiratory: Negative for cough.   Cardiovascular: Negative for chest pain.  Neurological: Positive for headaches. Negative for light-headedness and numbness.  Psychiatric/Behavioral: Positive for sleep disturbance (due to headaches).       Objective:   Physical Exam  Constitutional: She is oriented to person, place, and time. She appears well-developed and well-nourished. No distress.  HENT:  Head: Normocephalic and atraumatic.  Right Ear: External ear normal.  Left Ear: External ear normal.  Nose: Nose normal.  Mouth/Throat: Oropharynx is clear and moist. No oropharyngeal exudate.  Eyes: Conjunctivae and EOM are normal. Pupils are equal, round, and reactive to light. Right eye exhibits no discharge. Left eye exhibits no discharge.  Neck: Normal range of motion. Neck supple. Spinous process tenderness and muscular tenderness present. No rigidity. No edema and normal range of motion present.  Musculoskeletal: Normal range of motion. She exhibits no edema or tenderness.  Lymphadenopathy:    She has no cervical adenopathy.  Neurological: She is alert and oriented to person, place, and time. She has normal reflexes. No cranial nerve deficit.  Skin: Skin is warm and dry. She is not diaphoretic.  Psychiatric: She has a normal mood and affect. Her behavior is normal. Judgment and thought content normal.  Vitals reviewed.     BP 117/74 mmHg  Pulse 83  Temp(Src) 98 F (36.7 C) (Oral)  Resp 17  Ht 5' 3.5" (1.613 m)  Wt 115 lb (52.164 kg)  BMI 20.05 kg/m2  SpO2 93%  LMP 03/19/2016 Wt Readings from Last 3 Encounters:  03/19/16 115 lb (52.164 kg)  09/28/15 111 lb (50.349 kg)  03/09/14 109 lb (49.442 kg)       Assessment & Plan:  1.  Episodic tension-type headache, not intractable -Provided written and verbal information regarding diagnosis and treatment. - cyclobenzaprine (FLEXERIL) 10 MG tablet; Take 1 tablet (10 mg total) by mouth 3 (three) times daily as needed for muscle spasms.  Dispense: 30 tablet; Refill: 0 - naproxen (NAPROSYN) 500 MG tablet; Take 1 tablet (500 mg total) by mouth 2 (two) times daily as needed.  Dispense: 30 tablet; Refill: 0 - RTC precautions reviewed- visual changes, worsening pain, fever, no improvement in  3-4 days - encouraged stress relief, massage, ROM  Olean Ree, FNP-BC  [facility]  03/19/2016 11:13 AM

## 2016-03-19 NOTE — Patient Instructions (Addendum)
   IF you received an x-ray today, you will receive an invoice from [facility]. Please contact [facility] at [phone] with questions or concerns regarding your invoice.   IF you received labwork today, you will receive an invoice from [facility]. Please contact [facility] at [phone] with questions or concerns regarding your invoice.   Our billing staff will not be able to assist you with questions regarding bills from these companies.  You will be contacted with the lab results as soon as they are available. The fastest way to get your results is to activate your My Chart account. Instructions are located on the last page of this paperwork. If you have not heard from us regarding the results in 2 weeks, please contact this office.     Tension Headache A tension headache is a feeling of pain, pressure, or aching that is often felt over the front and sides of the head. The pain can be dull, or it can feel tight (constricting). Tension headaches are not normally associated with nausea or vomiting, and they do not get worse with physical activity. Tension headaches can last from 30 minutes to several days. This is the most common type of headache. CAUSES The exact cause of this condition is not known. Tension headaches often begin after stress, anxiety, or depression. Other triggers may include:  Alcohol.  Too much caffeine, or caffeine withdrawal.  Respiratory infections, such as colds, flu, or sinus infections.  Dental problems or teeth clenching.  Fatigue.  Holding your head and neck in the same position for a long period of time, such as while using a computer.  Smoking. SYMPTOMS Symptoms of this condition include:  A feeling of pressure around the head.  Dull, aching head pain.  Pain felt over the front and sides of the head.  Tenderness in the muscles of the head, neck, and shoulders. DIAGNOSIS This condition may be  diagnosed based on your symptoms and a physical exam. Tests may be done, such as a CT scan or an MRI of your head. These tests may be done if your symptoms are severe or unusual. TREATMENT This condition may be treated with lifestyle changes and medicines to help relieve symptoms. HOME CARE INSTRUCTIONS Managing Pain  Take over-the-counter and prescription medicines only as told by your health care provider.  Lie down in a dark, quiet room when you have a headache.  If directed, apply ice to the head and neck area:  Put ice in a plastic bag.  Place a towel between your skin and the bag.  Leave the ice on for 20 minutes, 2-3 times per day.  Use a heating pad or a hot shower to apply heat to the head and neck area as told by your health care provider. Eating and Drinking  Eat meals on a regular schedule.  Limit alcohol use.  Decrease your caffeine intake, or stop using caffeine. General Instructions  Keep all follow-up visits as told by your health care provider. This is important.  Keep a headache journal to help find out what may trigger your headaches. For example, write down:  What you eat and drink.  How much sleep you get.  Any change to your diet or medicines.  Try massage or other relaxation techniques.  Limit stress.  Sit up straight, and avoid tensing your muscles.  Do not use tobacco products, including cigarettes, chewing tobacco, or e-cigarettes. If you need help quitting, ask your health care provider.    Exercise regularly as told by your health care provider.  Get 7-9 hours of sleep, or the amount recommended by your health care provider. SEEK MEDICAL CARE IF:  Your symptoms are not helped by medicine.  You have a headache that is different from what you normally experience.  You have nausea or you vomit.  You have a fever. SEEK IMMEDIATE MEDICAL CARE IF:  Your headache becomes severe.  You have repeated vomiting.  You have a stiff  neck.  You have a loss of vision.  You have problems with speech.  You have pain in your eye or ear.  You have muscular weakness or loss of muscle control.  You lose your balance or you have trouble walking.  You feel faint or you pass out.  You have confusion.   This information is not intended to replace advice given to you by your health care provider. Make sure you discuss any questions you have with your health care provider.   Document Released: 11/24/2005 Document Revised: 08/15/2015 Document Reviewed: 03/19/2015 Elsevier Interactive Patient Education 2016 Elsevier Inc.  

## 2016-04-20 IMAGING — CT CT HEAD W/O CM
1 of 2 series · 16 of 30 positions shown, 20 images · non-contrast
Comparison: Cervical spine radiographs from same day.

CLINICAL DATA: 23-year-old female status post fall, twisting
injury, head injury. Headache and lightheadedness. Initial
encounter.

EXAM:
CT HEAD WITHOUT CONTRAST
TECHNIQUE: Contiguous axial images were obtained from the base of the skull
through the vertex without intravenous contrast.

[Series 3: head 2.0 h70h · axial · 0.39mm/px · z∈[+1402,+1546]mm · 16 of 82 slices shown, 20 images]
[im 5/82  brain]
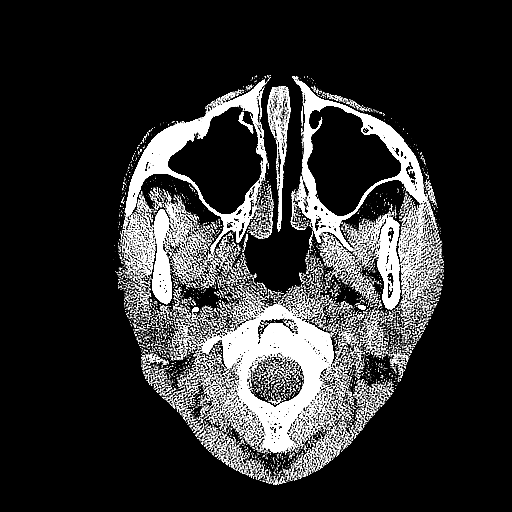
[im 5/82  bone]
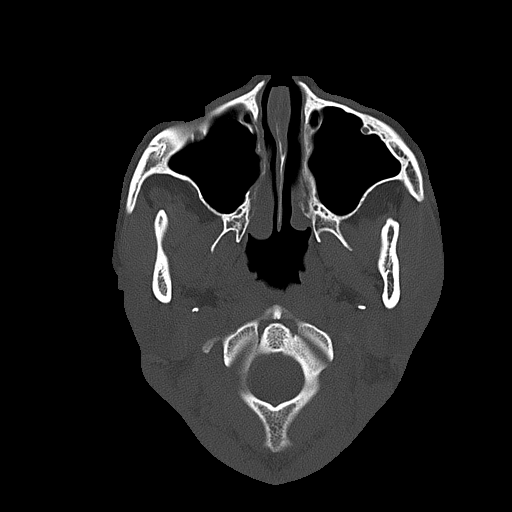
[im 9/82  brain]
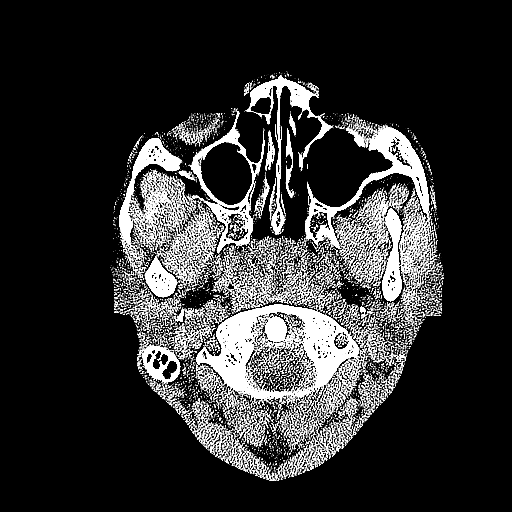
[im 13/82  brain]
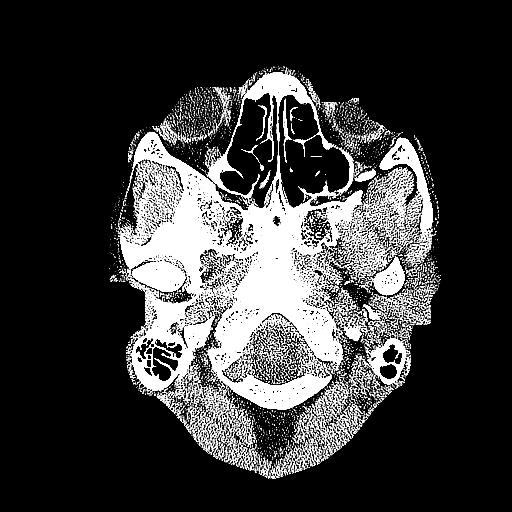
[im 21/82  brain]
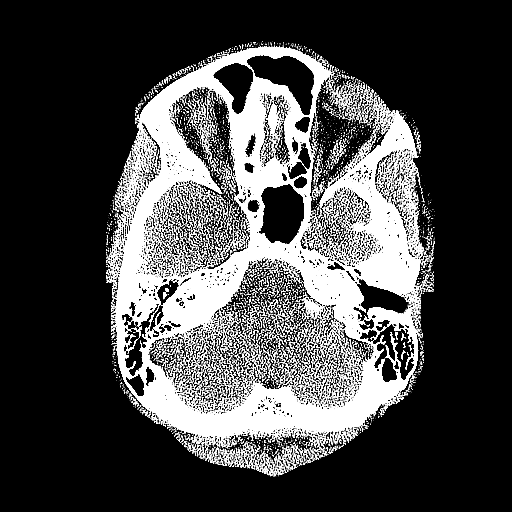
[im 25/82  brain]
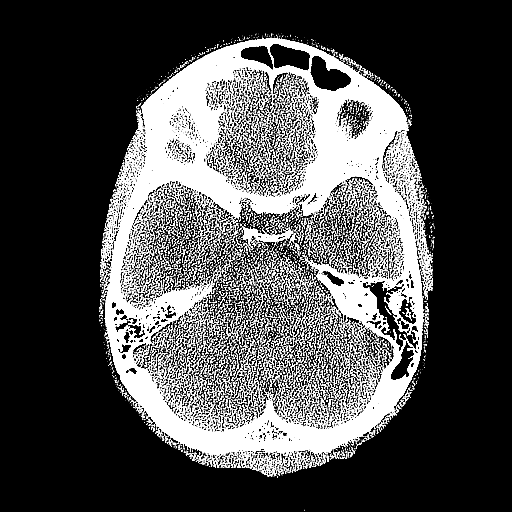
[im 25/82  bone]
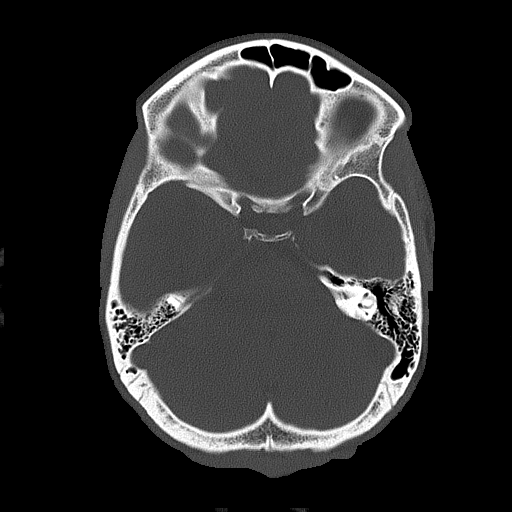
[im 29/82  brain]
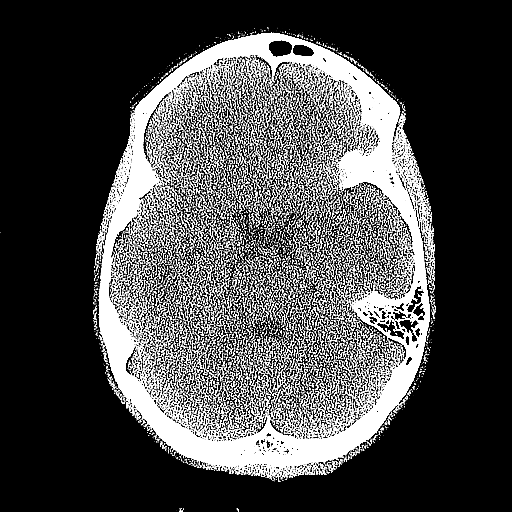
[im 33/82  brain]
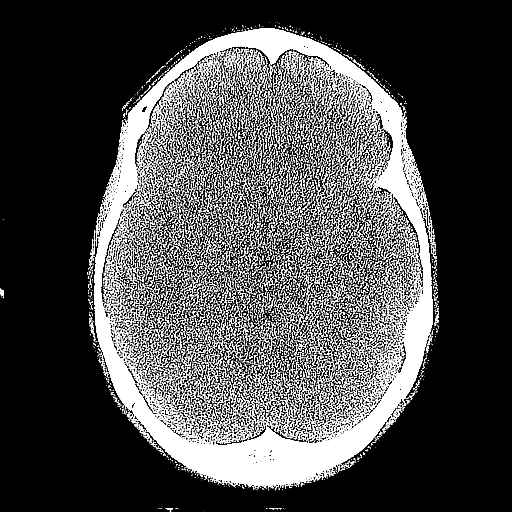
[im 37/82  brain]
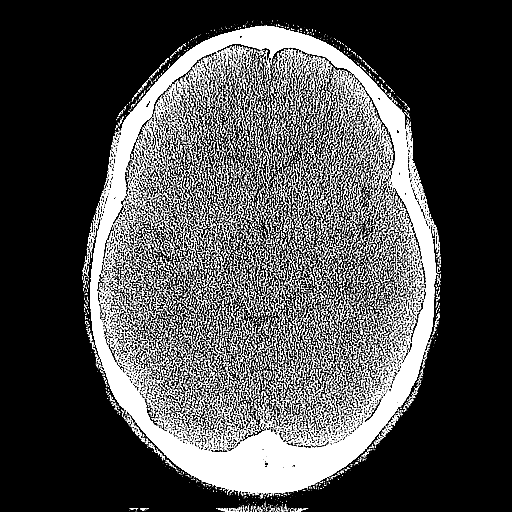
[im 45/82  brain]
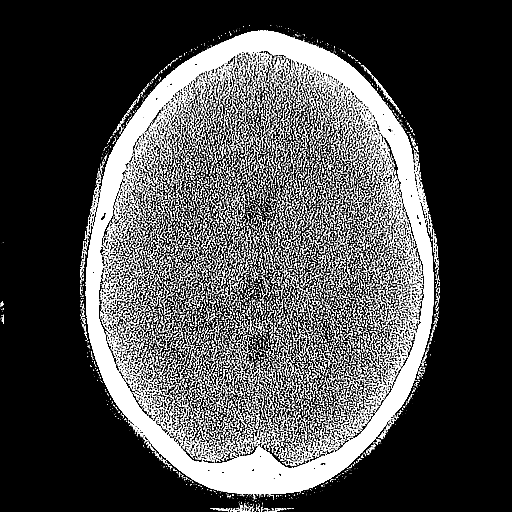
[im 45/82  bone]
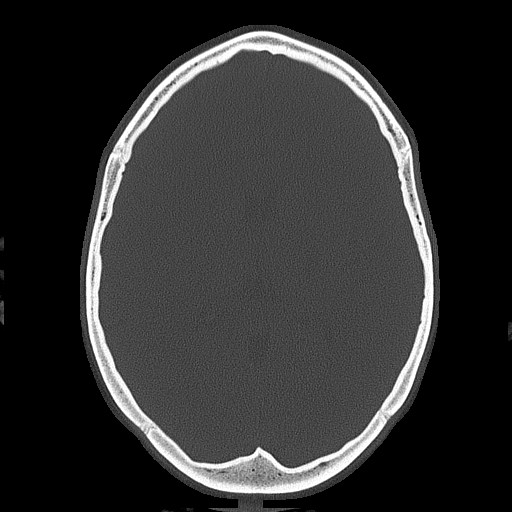
[im 49/82  brain]
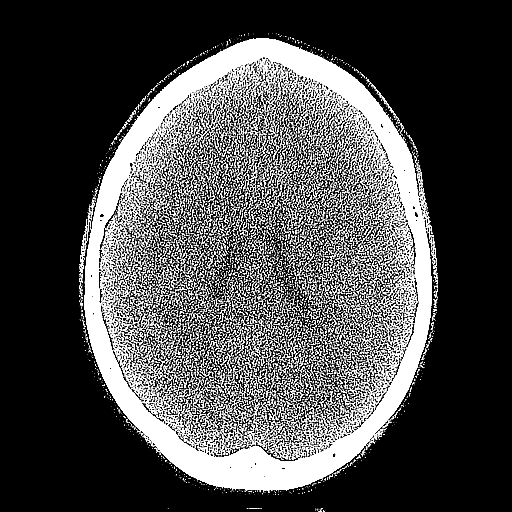
[im 53/82  brain]
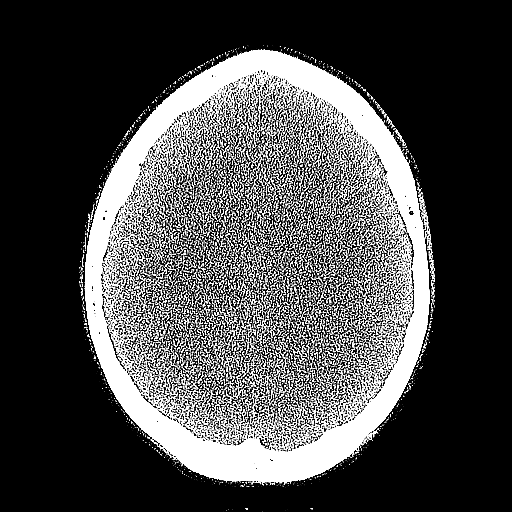
[im 57/82  brain]
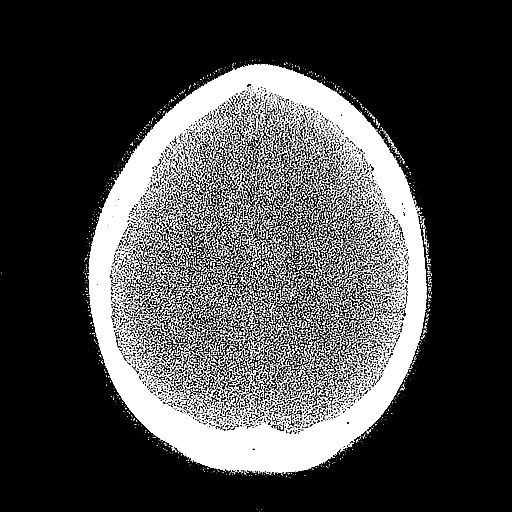
[im 61/82  brain]
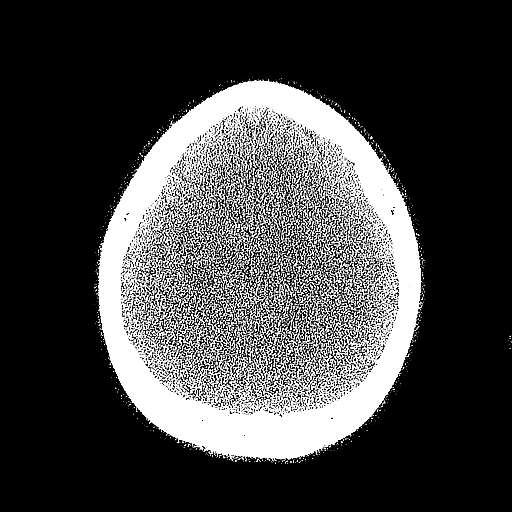
[im 61/82  bone]
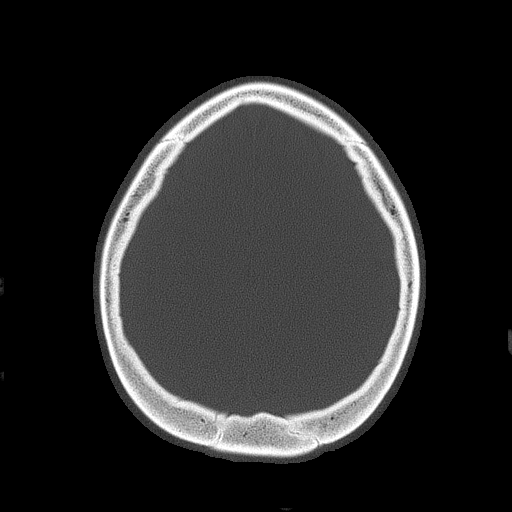
[im 69/82  brain]
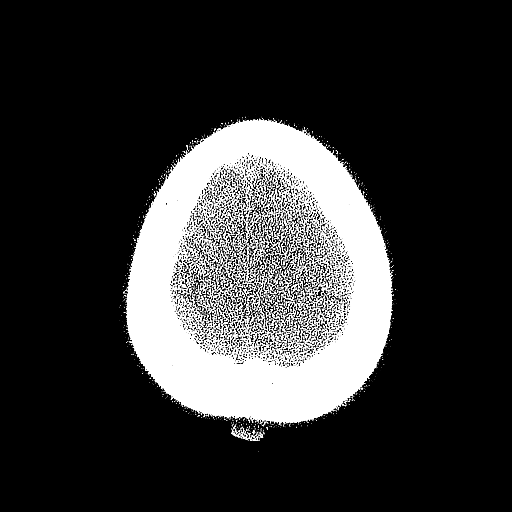
[im 73/82  brain]
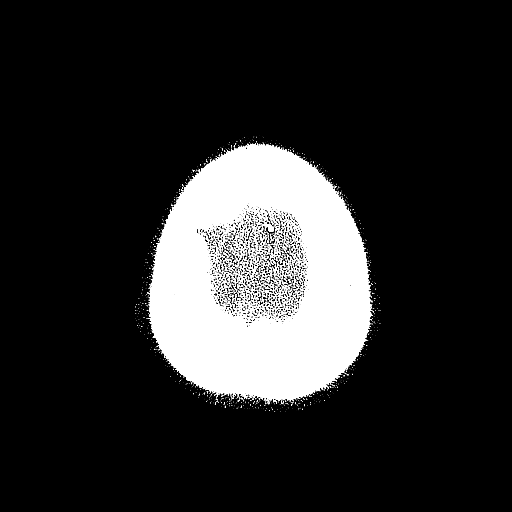
[im 77/82  brain]
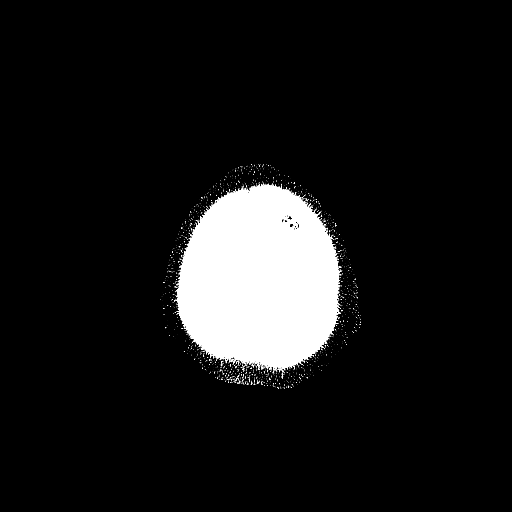

[16 of 30 positions shown; findings below may reference images not displayed]

FINDINGS: Visible paranasal sinuses and mastoids are clear except for minor
ethmoid mucosal thickening on the right. No acute osseous
abnormality identified. Visualized orbits and scalp soft tissues are
within normal limits.

Cerebral volume is normal. No midline shift, ventriculomegaly, mass
effect, evidence of mass lesion, intracranial hemorrhage or evidence
of cortically based acute infarction. Gray-white matter
differentiation is within normal limits throughout the brain. No
suspicious intracranial vascular hyperdensity.
IMPRESSION: Normal noncontrast CT appearance of the brain.

## 2016-08-27 ENCOUNTER — Ambulatory Visit (INDEPENDENT_AMBULATORY_CARE_PROVIDER_SITE_OTHER): Payer: BLUE CROSS/BLUE SHIELD | Admitting: Physician Assistant

## 2016-08-27 VITALS — BP 124/80 | HR 75 | Temp 99.0°F | Resp 17 | Ht 64.5 in | Wt 109.0 lb

## 2016-08-27 DIAGNOSIS — F909 Attention-deficit hyperactivity disorder, unspecified type: Secondary | ICD-10-CM

## 2016-08-27 MED ORDER — METHYLPHENIDATE HCL ER (LA) 30 MG PO CP24: 30.0000 mg | ORAL_CAPSULE | ORAL | 0 refills | Status: DC

## 2016-08-27 MED ORDER — METHYLPHENIDATE HCL 10 MG PO TABS: 10.0000 mg | ORAL_TABLET | Freq: Two times a day (BID) | ORAL | 0 refills | Status: DC

## 2016-08-27 NOTE — Progress Notes (Signed)
Patient ID: Misty Ayers, female   DOB: 06/06/1991, 25 y.o.   MRN: [id number] [facility] [address], [place] [address] [phone]  By signing my name below I, Shelah LewandowskyJoseph Thomas, attest that this documentation has been prepared under the direction and in the presence of Trena PlattStephanie English PA. Electonically Signed. Shelah LewandowskyJoseph Thomas, Scribe 08/27/2016 at 9:52 AM  Date:  08/27/2016   Name:  Misty Ayers   DOB:  06/11/1991   MRN:  [id number]  PCP:  No PCP Per Patient   Chief Complaint  Patient presents with   patient requesting ADD medicine     History of Present Illness:  Misty Ayers is a 25 y.o. female patient who [facility] for her ADHD medication. Pt has been getting her medications filled [facility] with their nurse practitioner who was filling her prescription for ritalin every 3 months. Pt states she would prefer to start getting the prescription [facility] because she does everything else here. 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 Pt reports that she has been told that she should be on ritalin everyday. Pt is allergic to adderall.  There are no active problems to display for this patient.   Past Medical History:  Diagnosis Date   Anemia    Anxiety    Depression     No past surgical history on file.  Social History  Substance Use Topics   Smoking status: Never Smoker   Smokeless tobacco: Never Used   Alcohol use No    Family History  Problem Relation Age of Onset   Stroke Mother    Stroke Brother     Allergies  Allergen Reactions   Penicillins    unknown    Medication list has been reviewed and updated.  Current Outpatient Prescriptions on File Prior to Visit  Medication Sig Dispense Refill   methylphenidate (RITALIN LA) 30 MG 24 hr capsule Take 30 mg by mouth every morning.     No current facility-administered medications on file prior to visit.     ROS ROS unremarkable unless otherwise specified.  Physical Examination: BP 124/80 (BP Location: Right Arm, Patient Position: Sitting, Cuff Size: Normal)    Pulse 75    Temp 99 F (37.2 C) (Oral)    Resp 17    Ht 5' 4.5" (1.638 m)    Wt 109 lb (49.4 kg)    LMP 08/27/2016 (Approximate)    SpO2 94%    BMI 18.42 kg/m    Physical Exam  Constitutional: She is oriented to person, place, and time. She appears well-developed and well-nourished. No distress.  HENT:  Head: Normocephalic and atraumatic.  Right Ear: External ear normal.  Left Ear: External ear normal.  Eyes: Conjunctivae and EOM are normal. Pupils are equal, round, and reactive to light.  Cardiovascular: Normal rate, regular rhythm and normal heart sounds.  Exam reveals no gallop and no friction rub.   No murmur heard. Pulmonary/Chest: Effort normal and breath sounds normal. No respiratory distress. She has no decreased breath sounds. She has no wheezes. She has no rhonchi. She has no  rales.  Neurological: She is alert and oriented to person, place, and time.  Skin: She is not diaphoretic.  Psychiatric: She has a normal mood and affect. Her behavior is normal.     Assessment and Plan: Misty Ayers is a 25 y.o. female who is here today for medication refill of her ADHD medication that was given to her from her school.  Pt's pharmacy contacted and pharmacy stated that pt has not filled her any prescriptions since December 2016.  I'm going to go ahead and refill it for 1 month. She does have documentation from the [facility] attention specialist with an evaluation that is been scanned to system. I will allow her time to  get with the nurse practitioner who prescribes her to give me the information of her prescriptions. She has voiced understanding that she will submit this information for review prior to next refill.  Attention deficit hyperactivity disorder (ADHD), unspecified ADHD type - Plan: methylphenidate (RITALIN) 10 MG tablet, methylphenidate (RITALIN LA) 30 MG 24 hr capsule  Trena Platt, PA-C [facility] 9/21/20177:59 AM  I personally performed the services described in this documentation, which was scribed in my presence. The recorded information has been reviewed and is accurate.

## 2016-08-27 NOTE — Patient Instructions (Addendum)
Please contact and have the nurse practioner that you see, fax over your medical records for my review.  We can then refill the medication for an additional 2 refills.  But only after this information is sent.     IF you received an x-ray today, you will receive an invoice from [facility]. Please contact [facility] at [phone] with questions or concerns regarding your invoice.   IF you received labwork today, you will receive an invoice from [facility]. Please contact [facility] at [phone] with questions or concerns regarding your invoice.   Our billing staff will not be able to assist you with questions regarding bills from these companies.  You will be contacted with the lab results as soon as they are available. The fastest way to get your results is to activate your My Chart account. Instructions are located on the last page of this paperwork. If you have not heard from us regarding the results in 2 weeks, please contact this office.

## 2016-09-04 ENCOUNTER — Encounter: Payer: Self-pay | Admitting: Urgent Care

## 2016-09-04 ENCOUNTER — Ambulatory Visit (INDEPENDENT_AMBULATORY_CARE_PROVIDER_SITE_OTHER): Payer: BLUE CROSS/BLUE SHIELD | Admitting: Urgent Care

## 2016-09-04 VITALS — BP 100/60 | HR 91 | Temp 98.8°F | Resp 16 | Ht 63.5 in | Wt 107.4 lb

## 2016-09-04 DIAGNOSIS — Z23 Encounter for immunization: Secondary | ICD-10-CM

## 2016-09-04 DIAGNOSIS — Z7189 Other specified counseling: Secondary | ICD-10-CM | POA: Diagnosis not present

## 2016-09-04 DIAGNOSIS — Z111 Encounter for screening for respiratory tuberculosis: Secondary | ICD-10-CM

## 2016-09-04 DIAGNOSIS — Z7185 Encounter for immunization safety counseling: Secondary | ICD-10-CM

## 2016-09-04 DIAGNOSIS — Z0000 Encounter for general adult medical examination without abnormal findings: Secondary | ICD-10-CM | POA: Diagnosis not present

## 2016-09-04 LAB — CBC
HCT: 29.6 % — ABNORMAL LOW (ref 35.0–45.0)
Hemoglobin: 9 g/dL — ABNORMAL LOW (ref 11.7–15.5)
MCH: 21.8 pg — AB (ref 27.0–33.0)
MCHC: 30.4 g/dL — ABNORMAL LOW (ref 32.0–36.0)
MCV: 71.7 fL — AB (ref 80.0–100.0)
MPV: 9.8 fL (ref 7.5–12.5)
Platelets: 479 10*3/uL — ABNORMAL HIGH (ref 140–400)
RBC: 4.13 MIL/uL (ref 3.80–5.10)
RDW: 18.5 % — ABNORMAL HIGH (ref 11.0–15.0)
WBC: 4.4 10*3/uL (ref 3.8–10.8)

## 2016-09-04 LAB — POCT URINALYSIS DIP (MANUAL ENTRY)
BILIRUBIN UA: NEGATIVE
BILIRUBIN UA: NEGATIVE
Blood, UA: NEGATIVE
Glucose, UA: NEGATIVE
LEUKOCYTES UA: NEGATIVE
Nitrite, UA: NEGATIVE
PROTEIN UA: NEGATIVE
Spec Grav, UA: >=1.03
Urobilinogen, UA: 0.2
pH, UA: 5.5

## 2016-09-04 NOTE — Progress Notes (Signed)
MRN: [id number]  Subjective:   Ms. Misty Ayers is a 25 y.o. female presenting for annual physical exam and nursing school forms.  Medical care team includes: PCP: No PCP Per Patient Vision: Wears glasses, has regular eye exams. OB/GYN: Gynecologic care with PCP in [place], [place]. Last pap smear was normal. Plans on following up with them. Specialists: None.  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 She needs immunization review including titers and tuberculosis screen. Eats healthily. Exercises regularly. Denies smoking cigarettes, drinks 3-5 alcoholic beverages per week. Her ADHD will be managed by PA Colletta Maryland from our practice.  Misty Ayers does not have any active problems on his problem list.   Misty Ayers has a current medication list which includes the following prescription(s): methylphenidate and methylphenidate. She is allergic to penicillins.  Misty Ayers  has a past medical history of Anemia; Anxiety; and Depression. Also  has no past surgical history on file.  Her family history includes Stroke in her brother and mother.  Immunizations: TDAP is up to date 09/28/2015, MMR is up to date 09/28/2015. Influenza vaccine will be updated today.   ROS  Objective:   Vitals: BP 100/60 (BP Location: Right Arm, Patient Position: Sitting, Cuff Size: Normal)   Pulse 91   Temp 98.8 F (37.1 C) (Oral)   Resp 16   Ht 5' 3.5" (1.613 m)   Wt 107 lb 6.4 oz (48.7 kg)   LMP 08/27/2016 (Approximate)   SpO2 100%   BMI 18.73 kg/m    Hearing Screening   125Hz 250Hz 500Hz 1000Hz 2000Hz 3000Hz 4000Hz 6000Hz 8000Hz  Right ear:   Pass Pass Pass  Pass    Left ear:   Pass Fail Pass  Pass      Visual Acuity Screening   Right eye Left eye Both eyes  Without correction:     With correction: 20/20 20/20 20/15    Physical Exam  Constitutional: She is oriented to person, place, and time. She appears well-developed and  well-nourished.  HENT:  TM's intact bilaterally, no effusions or erythema. Nasal turbinates pink and moist, nasal passages patent. No sinus tenderness. Oropharynx clear, mucous membranes moist, dentition in good repair.  Eyes: Conjunctivae and EOM are normal. Pupils are equal, round, and reactive to light. Right eye exhibits no discharge. Left eye exhibits no discharge. No scleral icterus.  Neck: Normal range of motion. Neck supple. No thyromegaly present.  Cardiovascular: Normal rate, regular rhythm and intact distal pulses.  Exam reveals no gallop and no friction rub.   No murmur heard. Pulmonary/Chest: No respiratory distress. She has no wheezes. She has no rales.  Abdominal: Soft. Bowel sounds are normal. She exhibits no distension and no mass. There is no tenderness.  Musculoskeletal: Normal range of motion. She exhibits no edema or tenderness.  Lymphadenopathy:    She has no cervical adenopathy.  Neurological: She is alert and oriented to person, place, and time. She has normal reflexes.  Skin: Skin is warm and dry. No rash noted. No erythema. No pallor.  Psychiatric: She has a normal mood and affect.   Results for orders placed or performed in visit on 09/04/16 (from the past 24 hour(s))  POCT urinalysis dipstick     Status: None   Collection Time: 09/04/16 11:05 AM  Result Value Ref Range   Color, UA yellow yellow   Clarity, UA clear clear   Glucose, UA negative negative   Bilirubin, UA negative negative  Ketones, POC UA negative negative   Spec Grav, UA >=1.030    Blood, UA negative negative   pH, UA 5.5    Protein Ur, POC negative negative   Urobilinogen, UA 0.2    Nitrite, UA Negative Negative   Leukocytes, UA Negative Negative    Assessment and Plan :   1. Annual physical exam - Medically stable, labs pending. Discussed healthy lifestyle, diet, exercise, preventative care, vaccinations, and addressed patient's concerns.   2. Immunization counseling -  Measles/Mumps/Rubella Immunity pending  3. Tuberculosis screening - Quantiferon tb gold assay (blood) pending  4. Need for prophylactic vaccination and inoculation against influenza - Flu Vaccine QUAD 36+ mos IM   Jaynee Eagles, PA-C [facility] [phone] 09/04/2016  10:32 AM

## 2016-09-04 NOTE — Patient Instructions (Addendum)
Keeping You Healthy  Get These Tests 1. Blood Pressure- Have your blood pressure checked once a year by your health care provider.  Normal blood pressure is 120/80. 2. Weight- Have your body mass index (BMI) calculated to screen for obesity.  BMI is measure of body fat based on height and weight.  You can also calculate your own BMI at www.nhlbisupport.com/bmi/. 3. Cholesterol- Have your cholesterol checked every 5 years starting at age 23 then yearly starting at age 45. 4. Chlamydia, HIV, and other sexually transmitted diseases- Get screened every year until age 25, then within three months of each new sexual provider. 5. Pap Test - Every 1-5 years; discuss with your health care provider. 6. Mammogram- Every 1-2 years starting at age 40--50  Take these medicines  Calcium with Vitamin D-Your body needs 1200 mg of Calcium each day and 800-1000 IU of Vitamin D daily.  Your body can only absorb 500 mg of Calcium at a time so Calcium must be taken in 2 or 3 divided doses throughout the day.  Multivitamin with folic acid- Once daily if it is possible for you to become pregnant.  Get these Immunizations  Gardasil-Series of three doses; prevents HPV related illness such as genital warts and cervical cancer.  Menactra-Single dose; prevents meningitis.  Tetanus shot- Every 10 years.  Flu shot-Every year.  Take these steps 1. Do not smoke-Your healthcare provider can help you quit.  For tips on how to quit go to www.smokefree.gov or call 1-800 QUITNOW. 2. Be physically active- Exercise 5 days a week for at least 30 minutes.  If you are not already physically active, start slow and gradually work up to 30 minutes of moderate physical activity.  Examples of moderate activity include walking briskly, dancing, swimming, bicycling, etc. 3. Breast Cancer- A self breast exam every month is important for early detection of breast cancer.  For more information and instruction on self breast exams, ask your  healthcare provider or www.womenshealth.gov/faq/breast-self-exam.cfm. 4. Eat a healthy diet- Eat a variety of healthy foods such as fruits, vegetables, whole grains, low fat milk, low fat cheeses, yogurt, lean meats, poultry and fish, beans, nuts, tofu, etc.  For more information go to www. Thenutritionsource.org 5. Drink alcohol in moderation- Limit alcohol intake to one drink or less per day. Never drink and drive. 6. Depression- Your emotional health is as important as your physical health.  If you're feeling down or losing interest in things you normally enjoy please talk to your healthcare provider about being screened for depression. 7. Dental visit- Brush and floss your teeth twice daily; visit your dentist twice a year. 8. Eye doctor- Get an eye exam at least every 2 years. 9. Helmet use- Always wear a helmet when riding a bicycle, motorcycle, rollerblading or skateboarding. 10. Safe sex- If you may be exposed to sexually transmitted infections, use a condom. 11. Seat belts- Seat belts can save your live; always wear one. 12. Smoke/Carbon Monoxide detectors- These detectors need to be installed on the appropriate level of your home. Replace batteries at least once a year. 13. Skin cancer- When out in the sun please cover up and use sunscreen 15 SPF or higher. 14. Violence- If anyone is threatening or hurting you, please tell your healthcare provider.           IF you received an x-ray today, you will receive an invoice from [facility]. Please contact [facility] at [phone] with questions or concerns regarding your invoice.     IF you received labwork today, you will receive an invoice from [facility]. Please contact [facility] at [phone] with questions or concerns regarding your invoice.   Our billing staff will not be able to assist you with questions regarding bills from these companies.  You will be contacted with the lab  results as soon as they are available. The fastest way to get your results is to activate your My Chart account. Instructions are located on the last page of this paperwork. If you have not heard from us regarding the results in 2 weeks, please contact this office.     

## 2016-09-05 LAB — QUANTIFERON TB GOLD ASSAY (BLOOD)
Interferon Gamma Release Assay: NEGATIVE
Mitogen-Nil: >10 IU/mL
Quantiferon Nil Value: 0.02 IU/mL
Quantiferon Tb Ag Minus Nil Value: 0.02 IU/mL

## 2016-09-05 LAB — MEASLES/MUMPS/RUBELLA IMMUNITY
Mumps IgG: 298 AU/mL — ABNORMAL HIGH (ref <9.00)
RUBEOLA IGG: 156 [AU]/ml — AB (ref <25.00)
Rubella: 15.5 Index — ABNORMAL HIGH (ref <0.90)

## 2016-09-06 ENCOUNTER — Telehealth: Payer: Self-pay

## 2016-09-06 NOTE — Telephone Encounter (Signed)
Left message for patient explaining to her that her physical forms were ready for pickup at the front desk.

## 2016-09-08 ENCOUNTER — Telehealth: Payer: Self-pay

## 2016-09-08 NOTE — Telephone Encounter (Signed)
[facility]  Approximately 40 pages  09/08/16

## 2016-09-09 ENCOUNTER — Encounter: Payer: Self-pay | Admitting: Urgent Care

## 2016-09-11 ENCOUNTER — Encounter: Payer: Self-pay | Admitting: Urgent Care

## 2016-09-11 ENCOUNTER — Ambulatory Visit (INDEPENDENT_AMBULATORY_CARE_PROVIDER_SITE_OTHER): Payer: BLUE CROSS/BLUE SHIELD | Admitting: Urgent Care

## 2016-09-11 VITALS — BP 102/66 | HR 109 | Temp 99.1°F | Resp 18 | Ht 63.5 in | Wt 106.0 lb

## 2016-09-11 DIAGNOSIS — D649 Anemia, unspecified: Secondary | ICD-10-CM | POA: Diagnosis not present

## 2016-09-11 LAB — CBC
HCT: 28.8 % — ABNORMAL LOW (ref 35.0–45.0)
Hemoglobin: 9 g/dL — ABNORMAL LOW (ref 11.7–15.5)
MCH: 22.3 pg — ABNORMAL LOW (ref 27.0–33.0)
MCHC: 31.3 g/dL — AB (ref 32.0–36.0)
MCV: 71.3 fL — ABNORMAL LOW (ref 80.0–100.0)
MPV: 9.6 fL (ref 7.5–12.5)
PLATELETS: 290 10*3/uL (ref 140–400)
RBC: 4.04 MIL/uL (ref 3.80–5.10)
RDW: 18.6 % — AB (ref 11.0–15.0)
WBC: 3.6 10*3/uL — AB (ref 3.8–10.8)

## 2016-09-11 LAB — FERRITIN: FERRITIN: 4 ng/mL — AB (ref 10–154)

## 2016-09-11 MED ORDER — FERROUS SULFATE 325 (65 FE) MG PO TABS: 325.0000 mg | ORAL_TABLET | Freq: Three times a day (TID) | ORAL | 3 refills | Status: DC

## 2016-09-11 MED ORDER — DOCUSATE SODIUM 50 MG PO CAPS: 50.0000 mg | ORAL_CAPSULE | Freq: Two times a day (BID) | ORAL | 3 refills | Status: DC

## 2016-09-11 NOTE — Progress Notes (Signed)
° °  By signing my name below, I, Mesha Guinyard, attest that this documentation has been prepared under the direction and in the presence of Wallis BambergMario Mani, [place]PA-C.  Electronically Signed: Arvilla MarketMesha Guinyard, Medical Scribe. 09/11/16. 1:58 PM.  MRN: [id number] DOB: 11/27/1991  Subjective:   Misty Ayers is a 25 y.o. female presenting for follow up on anemia. Pt has had anemia in the past, but there wasn't any thorough work up. Pt is no longer taking iron supplements. Pt reports fatigue: especially during her menses, tinnitus, dizziness when getting up, and hair shedding. Pt eats "wierd foods" and chews on ice. Pt chipped her tooth when she was young and suspects it was due to anemia. Pt is currently taking ritalin and only uses ibuprofen during her menses and for HAs. Pt has never had to get a blood transfusion. Pt denies pallor appearence.  Misty Ayers is allergic to penicillins.  Misty Ayers  has a past medical history of Anemia; Anxiety; and Depression. Also  has no past surgical history on file.  Objective:   Vitals: BP 102/66 (BP Location: Right Arm, Patient Position: Sitting, Cuff Size: Small)    Pulse (!) 109    Temp 99.1 F (37.3 C) (Oral)    Resp 18    Ht 5' 3.5" (1.613 m)    Wt 106 lb (48.1 kg)    LMP 08/27/2016 (Approximate)    SpO2 100%    BMI 18.48 kg/m   Physical Exam  Constitutional: She is oriented to person, place, and time. She appears well-developed and well-nourished. No distress.  Eyes: Conjunctivae are normal.  Cardiovascular: Normal rate, regular rhythm and normal heart sounds.  Exam reveals no friction rub.   No murmur heard. Pulmonary/Chest: Effort normal and breath sounds normal. No respiratory distress. She has no wheezes. She has no rales.  Abdominal: Soft. Bowel sounds are normal. She exhibits no distension and no mass. There is no tenderness. There is no rebound and no guarding.  Neurological: She is alert and oriented to person, place, and time.  Skin: Skin is warm and  dry. Capillary refill takes 2 to 3 seconds. About 2 seconds.  Slight pallor  Nursing note and vitals reviewed.  Assessment and Plan :   1. Anemia, unspecified type - Labs pending, patient will start iron supplementation with stool softener for constipation associated with this. Recheck in 4 weeks unless otherwise dictated by lab results.  Wallis BambergMario Mani, PA-C [facility] [phone] 09/11/2016 1:58 PM

## 2016-09-11 NOTE — Patient Instructions (Addendum)
Anemia, Nonspecific Anemia is a condition in which the concentration of red blood cells or hemoglobin in the blood is below normal. Hemoglobin is a substance in red blood cells that carries oxygen to the tissues of the body. Anemia results in not enough oxygen reaching these tissues.  CAUSES  Common causes of anemia include:   Excessive bleeding. Bleeding may be internal or external. This includes excessive bleeding from periods (in women) or from the intestine.   Poor nutrition.   Chronic kidney, thyroid, and liver disease.  Bone marrow disorders that decrease red blood cell production.  Cancer and treatments for cancer.  HIV, AIDS, and their treatments.  Spleen problems that increase red blood cell destruction.  Blood disorders.  Excess destruction of red blood cells due to infection, medicines, and autoimmune disorders. SIGNS AND SYMPTOMS   Minor weakness.   Dizziness.   Headache.  Palpitations.   Shortness of breath, especially with exercise.   Paleness.  Cold sensitivity.  Indigestion.  Nausea.  Difficulty sleeping.  Difficulty concentrating. Symptoms may occur suddenly or they may develop slowly.  DIAGNOSIS  Additional blood tests are often needed. These help your health care provider determine the best treatment. Your health care provider will check your stool for blood and look for other causes of blood loss.  TREATMENT  Treatment varies depending on the cause of the anemia. Treatment can include:   Supplements of iron, vitamin B12, or folic acid.   Hormone medicines.   A blood transfusion. This may be needed if blood loss is severe.   Hospitalization. This may be needed if there is significant continual blood loss.   Dietary changes.  Spleen removal. HOME CARE INSTRUCTIONS Keep all follow-up appointments. It often takes many weeks to correct anemia, and having your health care provider check on your condition and your response to  treatment is very important. SEEK IMMEDIATE MEDICAL CARE IF:   You develop extreme weakness, shortness of breath, or chest pain.   You become dizzy or have trouble concentrating.  You develop heavy vaginal bleeding.   You develop a rash.   You have bloody or black, tarry stools.   You faint.   You vomit up blood.   You vomit repeatedly.   You have abdominal pain.  You have a fever or persistent symptoms for more than 2-3 days.   You have a fever and your symptoms suddenly get worse.   You are dehydrated.  MAKE SURE YOU:  Understand these instructions.  Will watch your condition.  Will get help right away if you are not doing well or get worse.   This information is not intended to replace advice given to you by your health care provider. Make sure you discuss any questions you have with your health care provider.   Document Released: 01/01/2005 Document Revised: 07/27/2013 Document Reviewed: 05/20/2013 Elsevier Interactive Patient Education [employer]2016 Elsevier Inc.     IF you received an x-ray today, you will receive an invoice from [facility]. Please contact [facility] at [phone] with questions or concerns regarding your invoice.   IF you received labwork today, you will receive an invoice from [facility]. Please contact [facility] at [phone] with questions or concerns regarding your invoice.   Our billing staff will not be able to assist you with questions regarding bills from these companies.  You will be contacted with the lab results as soon as they are available. The fastest way to get your results is to activate  your My Chart account. Instructions are located on the last page of this paperwork. If you have not heard from [place] regarding the results in 2 weeks, please contact this office.

## 2016-09-12 LAB — PATHOLOGIST SMEAR REVIEW

## 2016-09-15 ENCOUNTER — Encounter: Payer: Self-pay | Admitting: Urgent Care

## 2016-12-04 ENCOUNTER — Encounter: Payer: Self-pay | Admitting: Physician Assistant

## 2016-12-04 ENCOUNTER — Ambulatory Visit (INDEPENDENT_AMBULATORY_CARE_PROVIDER_SITE_OTHER): Payer: BLUE CROSS/BLUE SHIELD | Admitting: Physician Assistant

## 2016-12-04 VITALS — BP 122/70 | HR 73 | Temp 97.9°F | Resp 18 | Ht 63.5 in | Wt 108.0 lb

## 2016-12-04 DIAGNOSIS — L819 Disorder of pigmentation, unspecified: Secondary | ICD-10-CM

## 2016-12-04 DIAGNOSIS — R5383 Other fatigue: Secondary | ICD-10-CM | POA: Diagnosis not present

## 2016-12-04 DIAGNOSIS — D649 Anemia, unspecified: Secondary | ICD-10-CM

## 2016-12-04 DIAGNOSIS — F909 Attention-deficit hyperactivity disorder, unspecified type: Secondary | ICD-10-CM | POA: Diagnosis not present

## 2016-12-04 MED ORDER — METHYLPHENIDATE HCL ER (LA) 40 MG PO CP24: 40.0000 mg | ORAL_CAPSULE | ORAL | 0 refills | Status: DC

## 2016-12-04 MED ORDER — METHYLPHENIDATE HCL 10 MG PO TABS: 5.0000 mg | ORAL_TABLET | Freq: Two times a day (BID) | ORAL | 0 refills | Status: DC | PRN

## 2016-12-04 NOTE — Patient Instructions (Addendum)
Please start the mychart. I will have your lab results shortly.  We will likely need to refer you to an ear, nose and throat regarding the tinnitus. In the meantime, please try the new medication regimen, and let me know, by phone if this is working.   Please also look on psychologytoday.com for helpful information on anxiety, and possible counselors.  This may be something we need to start looking into, especially during high stress situations.      IF you received an x-ray today, you will receive an invoice from [facility]. Please contact [facility] at [phone] with questions or concerns regarding your invoice.   IF you received labwork today, you will receive an invoice from [facility]. Please contact [facility] at [phone] with questions or concerns regarding your invoice.   Our billing staff will not be able to assist you with questions regarding bills from these companies.  You will be contacted with the lab results as soon as they are available. The fastest way to get your results is to activate your My Chart account. Instructions are located on the last page of this paperwork. If you have not heard from us regarding the results in 2 weeks, please contact this office.

## 2016-12-04 NOTE — Progress Notes (Signed)
[facility] [address] [place] [address] [phone]  Date:  12/04/2016   Name:  Misty Ayers   DOB:  [birth year]   MRN:  [id number]  PCP:  No PCP Per Patient    History of Present Illness:  Misty Ayers is a 25 y.o. female patient who presents to [facility] for cc of medication refill and follow up of iron deficiency. She states that the fatigue has improved though has been present.  She has taken the prescribed supplement, for 1.5 months, but over the last month--just eating iron rich foods.  She has significant menstrual bleeding.  She states that she has also had hair falling out and her fingers appear red at the tips.  They may possibly be associated with temperature, but not with stressors.  No pain, numbness or tingling.  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  She does acknowledge that it is high stress for her.  She does have hx of anxiety.  Placed on ssri, but did not want to continue that.  Clinically diagnosed adhd at her schooling.  She states that it is improved but she will need to take an additional half tablet of immediate release after studying.  If study is heavy, she will take the 2nd half but may not sleep at night.     There are no active problems to display for this patient.   Past Medical History:  Diagnosis Date  . Anemia   . Anxiety   . Depression     History reviewed. No pertinent surgical history.  Social History  Substance Use Topics  . Smoking status: Never Smoker  . Smokeless tobacco: Never Used  . Alcohol use Yes     Comment: 3-5    Family History  Problem Relation Age of Onset  . Stroke Mother   . Hyperlipidemia Mother   . Hypertension Mother   . Mental illness Mother   . Stroke Brother   . Hypertension Father     Allergies  Allergen Reactions  . Penicillins     unknown    Medication list has been reviewed and updated.  Current Outpatient Prescriptions on File Prior to Visit   Medication Sig Dispense Refill  . docusate sodium (COLACE) 50 MG capsule Take 1 capsule (50 mg total) by mouth 2 (two) times daily. 60 capsule 3  . ferrous sulfate 325 (65 FE) MG tablet Take 1 tablet (325 mg total) by mouth 3 (three) times daily with meals. 90 tablet 3   No current facility-administered medications on file prior to visit.     ROS ROS otherwise unremarkable unless listed above.   Physical Examination: BP 122/70 (BP Location: Right Arm, Patient Position: Sitting, Cuff Size: Small)   Pulse 73   Temp 97.9 F (36.6 C) (Oral)   Resp 18   Ht 5' 3.5" (1.613 m)   Wt 108 lb (49 kg)   LMP 11/05/2016   SpO2 100%   BMI 18.83 kg/m  Ideal Body Weight: Weight in (lb) to have BMI = 25: 143.1  Physical Exam  Constitutional: She is oriented to person, place, and time. She appears well-developed and well-nourished. No distress.  HENT:  Head: Normocephalic and atraumatic. Hair is normal (nothing detectable).  Right Ear: External ear normal.  Left Ear: External ear normal.  Eyes: Conjunctivae and EOM are normal. Pupils are equal, round, and reactive to light.  Neck: No thyroid mass and no thyromegaly  present.  Cardiovascular: Normal rate.   Pulmonary/Chest: Effort normal. No respiratory distress.  Neurological: She is alert and oriented to person, place, and time.  Skin: She is not diaphoretic.  Psychiatric: She has a normal mood and affect. Her behavior is normal.     Assessment and Plan: Misty Ayers is a 25 y.o. female who is here today for cc of anemia follow up, and adhd medication. Increasing extended ritalin to 30m, and .5 tablet to take twice per day as needed by the end of the day. She will contact me if this dosing has improved effects.   Will recheck cbc, but also tsh and sed rate.  Possible raynaud's, auto-immune illness, anxiety disorder.   Will follow up with results.  Anemia, unspecified type - Plan: CBC, Ferritin, Sedimentation Rate,  CMP14+EGFR  Other fatigue - Plan: TSH, Sedimentation Rate, CMP14+EGFR  Discoloration of skin of finger - Plan: TSH, Sedimentation Rate, CMP14+EGFR  Attention deficit hyperactivity disorder (ADHD), unspecified ADHD type - Plan: methylphenidate (RITALIN) 10 MG tablet  SIvar Drape PA-C [facility] 12/28/20174:35 PM

## 2016-12-05 LAB — CBC
HEMATOCRIT: 36 % (ref 34.0–46.6)
Hemoglobin: 11.5 g/dL (ref 11.1–15.9)
MCH: 26.1 pg — AB (ref 26.6–33.0)
MCHC: 31.9 g/dL (ref 31.5–35.7)
MCV: 82 fL (ref 79–97)
Platelets: 287 10*3/uL (ref 150–379)
RBC: 4.4 x10E6/uL (ref 3.77–5.28)
RDW: 16.2 % — ABNORMAL HIGH (ref 12.3–15.4)
WBC: 3.2 10*3/uL — ABNORMAL LOW (ref 3.4–10.8)

## 2016-12-05 LAB — FERRITIN: Ferritin: 18 ng/mL (ref 15–150)

## 2016-12-05 LAB — TSH: TSH: 0.65 u[IU]/mL (ref 0.450–4.500)

## 2016-12-05 LAB — SEDIMENTATION RATE: Sed Rate: 6 mm/hr (ref 0–32)

## 2016-12-10 ENCOUNTER — Encounter: Payer: Self-pay | Admitting: Physician Assistant

## 2016-12-17 LAB — CMP14+EGFR
ALBUMIN: 4.5 g/dL (ref 3.5–5.5)
ALK PHOS: 54 IU/L (ref 39–117)
ALT: 9 IU/L (ref 0–32)
AST: 17 IU/L (ref 0–40)
Albumin/Globulin Ratio: 1.4 (ref 1.2–2.2)
BUN / CREAT RATIO: 15 (ref 9–23)
BUN: 11 mg/dL (ref 6–20)
Bilirubin Total: 0.4 mg/dL (ref 0.0–1.2)
CO2: 25 mmol/L (ref 18–29)
Calcium: 9.8 mg/dL (ref 8.7–10.2)
Chloride: 100 mmol/L (ref 96–106)
Creatinine, Ser: 0.73 mg/dL (ref 0.57–1.00)
GFR calc Af Amer: 133 mL/min/{1.73_m2} (ref >59)
GFR calc non Af Amer: 116 mL/min/{1.73_m2} (ref >59)
Globulin, Total: 3.3 g/dL (ref 1.5–4.5)
Glucose: 97 mg/dL (ref 65–99)
Potassium: 4.3 mmol/L (ref 3.5–5.2)
SODIUM: 140 mmol/L (ref 134–144)
Total Protein: 7.8 g/dL (ref 6.0–8.5)

## 2017-01-05 ENCOUNTER — Encounter: Payer: Self-pay | Admitting: Physician Assistant

## 2017-01-20 ENCOUNTER — Encounter: Payer: Self-pay | Admitting: Physician Assistant

## 2017-01-22 ENCOUNTER — Telehealth: Payer: Self-pay | Admitting: Physician Assistant

## 2017-01-22 DIAGNOSIS — F909 Attention-deficit hyperactivity disorder, unspecified type: Secondary | ICD-10-CM

## 2017-01-22 MED ORDER — METHYLPHENIDATE HCL 10 MG PO TABS: 5.0000 mg | ORAL_TABLET | Freq: Two times a day (BID) | ORAL | 0 refills | Status: DC | PRN

## 2017-01-22 MED ORDER — METHYLPHENIDATE HCL ER (LA) 40 MG PO CP24: 40.0000 mg | ORAL_CAPSULE | ORAL | 0 refills | Status: DC

## 2017-01-22 NOTE — Telephone Encounter (Signed)
Refill given

## 2017-01-27 ENCOUNTER — Ambulatory Visit (INDEPENDENT_AMBULATORY_CARE_PROVIDER_SITE_OTHER): Payer: BLUE CROSS/BLUE SHIELD | Admitting: Physician Assistant

## 2017-01-27 ENCOUNTER — Encounter: Payer: Self-pay | Admitting: Physician Assistant

## 2017-01-27 VITALS — BP 116/72 | HR 101 | Temp 98.6°F | Resp 16 | Ht 63.0 in | Wt 108.0 lb

## 2017-01-27 DIAGNOSIS — N920 Excessive and frequent menstruation with regular cycle: Secondary | ICD-10-CM | POA: Diagnosis not present

## 2017-01-27 MED ORDER — FLUOXETINE HCL 20 MG PO TABS: 20.0000 mg | ORAL_TABLET | Freq: Every day | ORAL | 3 refills | Status: DC

## 2017-01-27 NOTE — Progress Notes (Signed)
[facility] [address], [place] [id number] [phone]  Date:  01/27/2017   Name:  Misty MallickChristele Misty Ayers   DOB:  02/10/1991   MRN:  [id number]  PCP:  No PCP Per Patient    History of Present Illness:  Misty Ayers is a 26 y.o. female patient who [facility].   Menses: heavy flow for the first couple of days.  This was the last period about 3 weeks.  There was some lightheadedness.  She went through about 1 pad per hour for 3 hours, but then it slowed down.  She is currently not sexually active for 2 years. She does have heavy cramping.  26 years old last and first pap with normal findings.  She had cravings of chalk and ice for a couple weeks after her period.  She took iron following the menstrual period.   There are no active problems to display for this patient.   Past Medical History:  Diagnosis Date  . Anemia   . Anxiety   . Depression     History reviewed. No pertinent surgical history.  Social History  Substance Use Topics  . Smoking status: Never Smoker  . Smokeless tobacco: Never Used  . Alcohol use Yes     Comment: 3-5    Family History  Problem Relation Age of Onset  . Stroke Mother   . Hyperlipidemia Mother   . Hypertension Mother   . Mental illness Mother   . Stroke Brother   . Hypertension Father     Allergies  Allergen Reactions  . Penicillins     unknown    Medication list has been reviewed and updated.  Current Outpatient Prescriptions on File Prior to Visit  Medication Sig Dispense Refill  . methylphenidate (RITALIN LA) 40 MG 24 hr capsule Take 1 capsule (40 mg total) by mouth every morning. 30 capsule 0  . methylphenidate (RITALIN) 10 MG tablet Take 0.5 tablets (5 mg total) by mouth 2 (two) times daily as needed. As needed 30 tablet 0  . docusate sodium (COLACE) 50 MG capsule Take 1 capsule (50 mg total) by mouth 2 (two) times daily. (Patient not taking: Reported on 01/27/2017) 60 capsule 3  .  ferrous sulfate 325 (65 FE) MG tablet Take 1 tablet (325 mg total) by mouth 3 (three) times daily with meals. (Patient not taking: Reported on 01/27/2017) 90 tablet 3   No current facility-administered medications on file prior to visit.     ROS ROS otherwise unremarkable unless listed above.  Physical Examination: BP 116/72   Pulse (!) 101   Temp 98.6 F (37 C) (Oral)   Resp 16   Ht 5\' 3"  (1.6 Misty)   Wt 108 lb (49 kg)   LMP 01/11/2017   SpO2 100%   BMI 19.13 kg/Misty  Ideal Body Weight: Weight in (lb) to have BMI = 25: 140.8  Physical Exam  Constitutional: She is oriented to person, place, and time. She appears well-developed and well-nourished. No distress.  HENT:  Head: Normocephalic and atraumatic.  Right Ear: External ear normal.  Left Ear: External ear normal.  Eyes: Conjunctivae and EOM are normal. Pupils are equal, round, and reactive to light.  Cardiovascular: Normal rate.   Pulmonary/Chest: Effort normal. No respiratory distress.  Genitourinary: Vagina normal and uterus normal. Pelvic exam was performed with patient supine. There is no rash on the right labia. There is no rash on the left labia.  Cervix exhibits no motion tenderness and no discharge. Right adnexum displays no mass, no tenderness and no fullness. Left adnexum displays no mass, no tenderness and no fullness.  Neurological: She is alert and oriented to person, place, and time.  Skin: She is not diaphoretic.  Psychiatric: She has a normal mood and affect. Her behavior is normal.     Assessment and Plan: Misty Ayers is a 26 y.o. female who is here today for pap smear. Menorrhagia with regular cycle - Plan: CBC, Pap IG and Chlamydia/Gonococcus, NAA  Misty Platt, PA-C [facility] 2/28/201810:36 PM

## 2017-01-27 NOTE — Patient Instructions (Addendum)
  I will follow up with you regarding your blood work.      IF you received an x-ray today, you will receive an invoice from [facility]. Please contact [facility] at [phone] with questions or concerns regarding your invoice.   IF you received labwork today, you will receive an invoice from [facility]. Please contact [facility] at [phone] with questions or concerns regarding your invoice.   Our billing staff will not be able to assist you with questions regarding bills from these companies.  You will be contacted with the lab results as soon as they are available. The fastest way to get your results is to activate your My Chart account. Instructions are located on the last page of this paperwork. If you have not heard from us regarding the results in 2 weeks, please contact this office.

## 2017-01-28 LAB — CBC
HEMATOCRIT: 36.1 % (ref 34.0–46.6)
Hemoglobin: 11.1 g/dL (ref 11.1–15.9)
MCH: 26.1 pg — ABNORMAL LOW (ref 26.6–33.0)
MCHC: 30.7 g/dL — ABNORMAL LOW (ref 31.5–35.7)
MCV: 85 fL (ref 79–97)
Platelets: 307 10*3/uL (ref 150–379)
RBC: 4.26 x10E6/uL (ref 3.77–5.28)
RDW: 14.5 % (ref 12.3–15.4)
WBC: 4.2 10*3/uL (ref 3.4–10.8)

## 2017-01-30 LAB — PAP IG AND CT-NG NAA
Chlamydia, Nuc. Acid Amp: NEGATIVE
Gonococcus by Nucleic Acid Amp: NEGATIVE
PAP SMEAR COMMENT: 0

## 2017-02-06 ENCOUNTER — Encounter: Payer: Self-pay | Admitting: Physician Assistant

## 2017-02-06 DIAGNOSIS — R87611 Atypical squamous cells cannot exclude high grade squamous intraepithelial lesion on cytologic smear of cervix (ASC-H): Secondary | ICD-10-CM

## 2017-02-09 ENCOUNTER — Encounter: Payer: Self-pay | Admitting: Physician Assistant

## 2017-02-17 ENCOUNTER — Encounter: Payer: Self-pay | Admitting: Physician Assistant

## 2017-02-25 ENCOUNTER — Other Ambulatory Visit: Payer: Self-pay | Admitting: Physician Assistant

## 2017-02-25 DIAGNOSIS — F909 Attention-deficit hyperactivity disorder, unspecified type: Secondary | ICD-10-CM

## 2017-02-25 MED ORDER — METHYLPHENIDATE HCL ER (LA) 40 MG PO CP24: 40.0000 mg | ORAL_CAPSULE | ORAL | 0 refills | Status: DC

## 2017-02-25 MED ORDER — METHYLPHENIDATE HCL 10 MG PO TABS: 5.0000 mg | ORAL_TABLET | Freq: Two times a day (BID) | ORAL | 0 refills | Status: DC | PRN

## 2017-02-26 ENCOUNTER — Other Ambulatory Visit: Payer: Self-pay | Admitting: Physician Assistant

## 2017-02-26 DIAGNOSIS — F909 Attention-deficit hyperactivity disorder, unspecified type: Secondary | ICD-10-CM

## 2017-02-27 NOTE — Telephone Encounter (Signed)
Up front for refills

## 2017-03-09 ENCOUNTER — Ambulatory Visit: Payer: BLUE CROSS/BLUE SHIELD | Admitting: Obstetrics & Gynecology

## 2017-03-11 ENCOUNTER — Ambulatory Visit (INDEPENDENT_AMBULATORY_CARE_PROVIDER_SITE_OTHER): Payer: BLUE CROSS/BLUE SHIELD | Admitting: Physician Assistant

## 2017-03-11 ENCOUNTER — Encounter: Payer: Self-pay | Admitting: Physician Assistant

## 2017-03-11 DIAGNOSIS — F909 Attention-deficit hyperactivity disorder, unspecified type: Secondary | ICD-10-CM | POA: Diagnosis not present

## 2017-03-11 MED ORDER — METHYLPHENIDATE HCL ER (LA) 40 MG PO CP24: 40.0000 mg | ORAL_CAPSULE | ORAL | 0 refills | Status: DC

## 2017-03-11 MED ORDER — METHYLPHENIDATE HCL 10 MG PO TABS: 5.0000 mg | ORAL_TABLET | Freq: Two times a day (BID) | ORAL | 0 refills | Status: DC | PRN

## 2017-03-11 NOTE — Patient Instructions (Signed)
     IF you received an x-ray today, you will receive an invoice from [facility]. Please contact [facility] at [phone] with questions or concerns regarding your invoice.   IF you received labwork today, you will receive an invoice from [facility]. Please contact [facility] at [phone] with questions or concerns regarding your invoice.   Our billing staff will not be able to assist you with questions regarding bills from these companies.  You will be contacted with the lab results as soon as they are available. The fastest way to get your results is to activate your My Chart account. Instructions are located on the last page of this paperwork. If you have not heard from us regarding the results in 2 weeks, please contact this office.     

## 2017-03-11 NOTE — Progress Notes (Signed)
PRIMARY CARE AT [facility] [address], [place] [address] [phone]   Date:  03/11/2017   Name:  Misty Ayers   DOB:  06/29/1991   MRN:  [id number]  PCP:  No PCP Per Patient    History of Present Illness:  Misty Ayers is a 26 y.o. female patient who presents to PCP with  Chief Complaint  Patient presents with  . Medication Refill    RITALIN LA and RITALIN     Patient returns to discuss medication refill. Patient has not taken the prozac for 2 weeks, after feeling increased jittering, heart racing, and anxiety.  She states that she was taking this for 2 weeks.  She noticed when she increased to the full tablet ( ), these symptoms occurred.  The ritalin would increase her symptoms.  She stopped taking the ritalin 6 days ago.  She feels that she is back to "herself".  She would like to maintain an anxiety medicine if she can.  She does not want anything to make her sleepy.  The prozac would make her fatigued, and she would take the ritalin to keep her awake, which she understands, is not helping.  Last dose of the ritalin was 6 days ago.  She last took the prozac 02/19/2017. She reported that she felt stuck like she did not want to get out of bed, and lethargy.  She does not feel like this anymore.   She is managing her anxiety with managing her work.  She will not do work in the morning.  .  She is seeing a Magazine features editor, and group bible study group.   She will generally take the  Friday she will take a  once, and twice if she is continuing to study.  She will do the same Saturday.  Sunday  and 10 mg if evening studying.  M-Th, she will take the 40 then  at night.   There are no active problems to display for this patient.   Past Medical History:  Diagnosis Date  . Anemia   . Anxiety   . Depression     No past surgical history on file.  Social History  Substance Use Topics  . Smoking status: Never Smoker  . Smokeless tobacco: Never Used   . Alcohol use Yes     Comment: 3-5    Family History  Problem Relation Age of Onset  . Stroke Mother   . Hyperlipidemia Mother   . Hypertension Mother   . Mental illness Mother   . Stroke Brother   . Hypertension Father     Allergies  Allergen Reactions  . Penicillins     unknown    Medication list has been reviewed and updated.  Current Outpatient Prescriptions on File Prior to Visit  Medication Sig Dispense Refill  . ferrous sulfate 325 (65 FE) MG tablet Take 1 tablet (325 mg total) by mouth 3 (three) times daily with meals. 90 tablet 3  . methylphenidate (RITALIN LA) 40 MG 24 hr capsule Take 1 capsule (40 mg total) by mouth every morning. 30 capsule 0  . methylphenidate (RITALIN) 10 MG tablet Take 0.5 tablets (5 mg total) by mouth 2 (two) times daily as needed. As needed 30 tablet 0  . docusate sodium (COLACE) 50 MG capsule Take 1 capsule (50 mg total) by mouth 2 (two) times daily. (Patient not taking: Reported on 01/27/2017) 60 capsule 3  . FLUoxetine (PROZAC) 20 MG tablet Take 1 tablet (20 mg total)  by mouth daily. Half tablet for the first week.  Then increase to one full tablet. (Patient not taking: Reported on 03/11/2017) 30 tablet 3   No current facility-administered medications on file prior to visit.     ROS ROS otherwise unremarkable unless listed above.  Physical Examination: BP 116/71 (BP Location: Right Arm, Patient Position: Sitting, Cuff Size: Normal)   Pulse 92   Temp 98.3 F (36.8 C) (Oral)   Resp 16   Ht  (1.626 m)   Wt 108 lb 3.2 oz (49.1 kg)   LMP 03/10/2017   SpO2 100%   BMI 18.57 kg/m  Ideal Body Weight: Weight in (lb) to have BMI = 25: 145.3  Physical Exam  Constitutional: She is oriented to person, place, and time. She appears well-developed and well-nourished. No distress.  HENT:  Head: Normocephalic and atraumatic.  Right Ear: External ear normal.  Left Ear: External ear normal.  Eyes: Conjunctivae and EOM are normal. Pupils are  equal, round, and reactive to light.  Cardiovascular: Normal rate and regular rhythm.  Exam reveals no friction rub.   No murmur heard. Pulmonary/Chest: Effort normal. No respiratory distress.  Neurological: She is alert and oriented to person, place, and time. No cranial nerve deficit. Coordination normal.  Skin: Skin is warm. Capillary refill takes less than 2 seconds. She is not diaphoretic.  Psychiatric: She has a normal mood and affect. Her behavior is normal.     Assessment and Plan: Misty Ayers is a 26 y.o. female who is here today for medication refill.   --we will keep it at  where it was most effective.  She will continue her adhd medicine.    Attention deficit hyperactivity disorder (ADHD), unspecified ADHD type - Plan: methylphenidate (RITALIN LA) 40 MG 24 hr capsule, methylphenidate (RITALIN) 10 MG tablet, DISCONTINUED: methylphenidate (RITALIN LA) 40 MG 24 hr capsule, DISCONTINUED: methylphenidate (RITALIN) 10 MG tablet  Trena Platt, PA-C [facility] 4/4/201812:59 PM

## 2017-03-13 ENCOUNTER — Ambulatory Visit: Payer: BLUE CROSS/BLUE SHIELD | Admitting: Obstetrics & Gynecology

## 2017-03-27 ENCOUNTER — Ambulatory Visit (INDEPENDENT_AMBULATORY_CARE_PROVIDER_SITE_OTHER): Payer: BLUE CROSS/BLUE SHIELD | Admitting: Obstetrics & Gynecology

## 2017-03-27 ENCOUNTER — Encounter: Payer: Self-pay | Admitting: Obstetrics & Gynecology

## 2017-03-27 VITALS — BP 110/70 | Ht 63.0 in | Wt 106.0 lb

## 2017-03-27 DIAGNOSIS — R87611 Atypical squamous cells cannot exclude high grade squamous intraepithelial lesion on cytologic smear of cervix (ASC-H): Secondary | ICD-10-CM

## 2017-03-27 NOTE — Progress Notes (Signed)
    Misty Ayers 12/30/1990 [id number]        26 y.o.  [id number] accompanied by Boyfriend.  New patient presenting with ASCUS/Cannot r/o HG 01/2017 for Colposcopy  Sexually active in the past.  Not sexually active yet with current boyfriend.  Chlam-Gono neg 01/2017.  No pelvic pain.  No abnormal d/c.  Menses reg normal.  Past medical history,surgical history, problem list, medications, allergies, family history and social history were all reviewed and documented in the EPIC chart.  Directed ROS with pertinent positives and negatives documented in the history of present illness/assessment and plan.  Exam:  Vitals:   03/27/17 1358  BP: 110/70  Weight: 106 lb (48.1 kg)  Height:  (1.6 m)   General appearance:  Normal   Colposcopy Procedure Note Misty Ayers 03/27/2017  Indications:  Procedure Details  The risks and benefits of the procedure and Verbal informed consent obtained.  Speculum placed in vagina and excellent visualization of cervix achieved, cervix swabbed x 3 with acetic acid solution.  Findings:  Cervix colposcopy:  Physical Exam  Genitourinary:      Vaginal colposcopy:  Normal   Vulvar colposcopy:  Not done, but grossly normal  Perirectal colposcopy:  Not done, but grossly normal   Specimens: Bxs of cervix at 6, 8 and 12.  HPV 16-18-45  Complications:  None, good hemostasis .  Plan:  Per pending results   Assessment/Plan:  26 y.o.   1. Pap smear of cervix with ASCUS, cannot exclude HGSIL Colpo of cervix with Bxs.  Management per results. - HPV Type 16 and 18/45 RNA - Pathology Report    Misty Del MD, 2:08 PM 03/27/2017

## 2017-03-27 NOTE — Patient Instructions (Signed)
Your pap test in 01/2017 showed ASCUS/Cannot rule out High Grade.  A Colposcopy was done today with Cervical Biopsies and HPV 16-18-45 testing.  I will let you know the results as soon as available. It was a pleasure to meet you today!

## 2017-03-30 LAB — HPV TYPE 16 AND 18/45 RNA
HPV TYPE 16 RNA: NOT DETECTED
HPV TYPE 18/45 RNA: NOT DETECTED

## 2017-03-30 LAB — PATHOLOGY

## 2017-03-31 ENCOUNTER — Telehealth: Payer: Self-pay

## 2017-03-31 NOTE — Telephone Encounter (Signed)
Patient called in voice mail. I returned her call and left message for her to call me.

## 2017-03-31 NOTE — Telephone Encounter (Signed)
Left message for patient to call.

## 2017-03-31 NOTE — Telephone Encounter (Signed)
Patient informed. Appt scheduled. She is fine with having LEEP same day.

## 2017-03-31 NOTE — Telephone Encounter (Signed)
To: Aura Camps, RMA  Subject: Schedule f/u with me ASAP             CIN 3. Needs urgent f/u to discuss LEEP. Will do LEEP at that visit if patient agrees.

## 2017-04-02 ENCOUNTER — Ambulatory Visit (INDEPENDENT_AMBULATORY_CARE_PROVIDER_SITE_OTHER): Payer: BLUE CROSS/BLUE SHIELD | Admitting: Obstetrics & Gynecology

## 2017-04-02 ENCOUNTER — Encounter: Payer: Self-pay | Admitting: Obstetrics & Gynecology

## 2017-04-02 VITALS — BP 112/68

## 2017-04-02 DIAGNOSIS — D069 Carcinoma in situ of cervix, unspecified: Secondary | ICD-10-CM | POA: Diagnosis not present

## 2017-04-02 NOTE — Patient Instructions (Signed)
We discussed your Colposcopy Cervical Biopsy results which showed CIN 3, Severe Cervical Dysplasia (CIN 1 and 2 on the other Biopsies).  The decision was taken to proceed with a LEEP, which was done today.     LEEP POST-PROCEDURE INSTRUCTIONS  1. You may take Ibuprofen, Aleve or Tylenol for pain if needed.  Cramping is normal.  2. You will have black and/or bloody discharge at first.  This will lighten and then turn clear before completely resolving.  This will take 2 to 3 weeks.  3. Put nothing in your vagina until the bleeding or discharge stops (usually 2 or3 days), no intercourse x 3 weeks.  4. You need to call if you have redness around the biopsy site, if there is any unusual draining, if the bleeding is heavy, or if you are concerned.  5. Shower or bathe as normal  6. We will call you within one week with results or we will discuss the results at your follow-up appointment if needed.  You will need to return for a f/u Colposcopy in 4 months.

## 2017-04-02 NOTE — Progress Notes (Signed)
    Misty Ayers Oct 20, 1991 [id number]        26 y.o.  G0 Accompanied by boyfriend  RP:  Discuss Cervical Bx results and Manage  CIN 3 on Cervical Bx at 8 O'clock.  CIN 2 at 6 O'clock and CIN 1 at 12 O'clock.  HPV 16-18-45 neg.  Past medical history,surgical history, problem list, medications, allergies, family history and social history were all reviewed and documented in the EPIC chart.  Directed ROS with pertinent positives and negatives documented in the history of present illness/assessment and plan.  Exam:  Vitals:   04/02/17 1009  BP: 112/68   General appearance:  Normal LEEP (Leep electrosurgical excision procedure)    Indication For Surgery:  CIN 3   Anesthesia: Paracervical block with Lido 2%/Epinephrine 10 cc   Procedure:  LEEP (Loop electrosurgical excision procedure)  Description of Operation:  After the patient was verbally counseled the patient was placed in the low lithotomy position.  A coated speculum was inserted into the vagina and colposcopic examination was performed with 4% acidic acid with findings noted  per previous Colpo note.  Approximately 10 cc's of 2% xylocaine with epinephrine was infiltrated at 4 and 8 O'clock and intracervically. The Electrosurgical Generator was then turned on after the patient was grounded with pad electrode on her thigh and jewelry removed.  The settings on the generator were Blend 1 current 60 watts cut and 60 watts on the coagulation mode.  A white loop electrode was utilized to exercise the atypical transformation zone.  The tip of the electrode was placed 3 mm from the edge of the lesion at 5 o'clock position.  The electrode was activated and moved slowly over the lesion from 5 O'clock to 12 O'clock. The loop electrode was replaced with ball electrode set at 50 watts and the base of the crater was fulgurated circumferentially.  Monsell's paint was then applied for additional hemostasis.  A suture was placed at 12 o'clock  position of cervical biopsy specimen for orientation, and placed in formation fixative for pathology evaluation.  Patient tolerated the procedure well with minimal blood loss and without any complications.  After the procedure patient left office with stable vital signs and instructions sheet.  Assessment/Plan:  26 y.o. G0P0000   1. CIN III (cervical intraepithelial neoplasia grade III) with severe dysplasia Informed of results.  Significance of CIN3 with risks of progression to Cervical Ca discussed.  Decision to proceed with LEEP, done today under Paracervical Block.  Risks associated with LEEP including Cervical Incompetence reviewed. F/U Colpo in 4 months if Margins Neg.  Counseling >50% x 15 min on CIN3 Risks/Management.   [name], 10:26 AM 04/02/2017

## 2017-04-06 LAB — PATHOLOGY

## 2017-04-21 ENCOUNTER — Telehealth: Payer: Self-pay | Admitting: *Deleted

## 2017-04-21 NOTE — Telephone Encounter (Signed)
Pt called and left message in voicemail, had leep done on 04/02/17 called c/o bleeding and no "feeling good" feel weak and fatigue, craving ice chips. I called pt back and left message on pt voicemail to schedule OV with provider

## 2017-04-22 ENCOUNTER — Ambulatory Visit (INDEPENDENT_AMBULATORY_CARE_PROVIDER_SITE_OTHER): Payer: BLUE CROSS/BLUE SHIELD | Admitting: Obstetrics & Gynecology

## 2017-04-22 ENCOUNTER — Encounter: Payer: Self-pay | Admitting: Obstetrics & Gynecology

## 2017-04-22 VITALS — BP 136/88

## 2017-04-22 DIAGNOSIS — F419 Anxiety disorder, unspecified: Secondary | ICD-10-CM

## 2017-04-22 DIAGNOSIS — Z9889 Other specified postprocedural states: Secondary | ICD-10-CM

## 2017-04-22 DIAGNOSIS — N921 Excessive and frequent menstruation with irregular cycle: Secondary | ICD-10-CM | POA: Diagnosis not present

## 2017-04-22 LAB — CBC
HEMATOCRIT: 30.2 % — AB (ref 35.0–45.0)
HEMOGLOBIN: 9.4 g/dL — AB (ref 11.7–15.5)
MCH: 24.9 pg — ABNORMAL LOW (ref 27.0–33.0)
MCHC: 31.1 g/dL — ABNORMAL LOW (ref 32.0–36.0)
MCV: 79.9 fL — AB (ref 80.0–100.0)
MPV: 9.8 fL (ref 7.5–12.5)
Platelets: 404 10*3/uL — ABNORMAL HIGH (ref 140–400)
RBC: 3.78 MIL/uL — AB (ref 3.80–5.10)
RDW: 15 % (ref 11.0–15.0)
WBC: 4.6 10*3/uL (ref 3.8–10.8)

## 2017-04-22 NOTE — Patient Instructions (Signed)
1. Menometrorrhagia Menses and healing LEEP of Cervix.  Normal healing process, no sign of infection, no active bleeding today.  Iron supplement recommended. - CBC  2. Anxiety No panic attack at this time, but increased anxiety level.  Recommended f/u for evaluation asap with Fam MD.  Reassured about normal healing from LEEP and Patho Margins neg for HGSIL.  3. Status post LEEP (loop electrosurgical excision procedure) of cervix Normal healing process.  Good to see you today Donnette!  I'll inform you of the CBC result.

## 2017-04-22 NOTE — Progress Notes (Signed)
    Misty Ayers 07/02/1991 [id number]        26 y.o.  G0  Student.  Boyfriend present.  Abstinent.  RP:  Menometro post LEEP on 04/02/2017  Had mild bleeding post LEEP, then had moderate flow during her period from 5/2nd to 5/8th.  Then continued to bleed, but decreasing flow, wearing only one liner a day, to only pinkish flow today.  No pelvic pain.  No abnormal d/c vaginally otherwise.  No fever.  Also c/o SOB and tiredness since her period.  Mictions normal.  BMs normal.  Patho on LEEP:  Margins neg for HGSIL.  H/O ADHD on Ritalin.  H/O Anxiety and Depression.  Past medical history,surgical history, problem list, medications, allergies, family history and social history were all reviewed and documented in the EPIC chart.  Directed ROS with pertinent positives and negatives documented in the history of present illness/assessment and plan.  Exam:  Vitals:   04/22/17 1403  BP: 136/88   General appearance:  Normal, but anxious, tearful, speaks and breathes faster than normal.  Heart:  RCR, no murmur.   Lungs: Clear bilaterally, no adventitious sound  Gyn exam:  Vulva normal.                    Speculum:  Vagina normal.  Cervix healing well from LEEP, but in process, not completely closed back at EO.  No active bleeding in LEEP bed, no sign of infection.  Secretions beige pink, mild.  No odor.  Assessment/Plan:  26 y.o. G0  1. Menometrorrhagia Menses and healing LEEP of Cervix.  Normal healing process, no sign of infection, no active bleeding today.  Iron supplement recommended. - CBC  2. Anxiety No panic attack at this time, but increased anxiety level.  Recommended f/u for evaluation asap with Fam MD.  Reassured about normal healing from LEEP and Patho Margins neg for HGSIL.  3. Status post LEEP (loop electrosurgical excision procedure) of cervix Normal healing process.  Counseling on above issues >50% x 15 minutes.  Genia DelMarie-Lyne Nyjah Denio MD, 2:18 PM 04/22/2017

## 2017-05-01 ENCOUNTER — Encounter: Payer: Self-pay | Admitting: Obstetrics & Gynecology

## 2017-05-01 ENCOUNTER — Ambulatory Visit (INDEPENDENT_AMBULATORY_CARE_PROVIDER_SITE_OTHER): Payer: BLUE CROSS/BLUE SHIELD | Admitting: Obstetrics & Gynecology

## 2017-05-01 VITALS — BP 116/78

## 2017-05-01 DIAGNOSIS — Z9889 Other specified postprocedural states: Secondary | ICD-10-CM | POA: Diagnosis not present

## 2017-05-01 DIAGNOSIS — R42 Dizziness and giddiness: Secondary | ICD-10-CM

## 2017-05-01 DIAGNOSIS — D500 Iron deficiency anemia secondary to blood loss (chronic): Secondary | ICD-10-CM | POA: Diagnosis not present

## 2017-05-01 NOTE — Patient Instructions (Signed)
1. Status post LEEP (loop electrosurgical excision procedure) of cervix Healing well.  No bleeding and no sign of infection.  2. Lightheadedness Secondary to chronic anemia from heavy periods, worsened by further bleeding post LEEP.  3. Iron deficiency anemia due to chronic blood loss Continue on FeSO4 and high Iron diet.  F/U for Nexplanon insertion to decrease blood loss from heavy menses.  Risks/Benefits associated with Nexplanon discussed.  Declines BCPs, Patch, Nuvaring, DepoProvera and Progestin IUD.  Will repeat CBC in 3 months.  Good to see you today!  Will see you again for the Nexplanon insertion.

## 2017-05-01 NOTE — Progress Notes (Signed)
    Jamaiya Rich ReiningM Kidibu 02/09/1991 [id number]        26 y.o.  G0P0000  Abstinent  RP:  Continues to feel lightheaded  HPI:  Lightheaded frequently while doing her clinicals (working, walking).  No fainting.  Feels SOB when runs or climbs stairs.  No chest pain.  No Fam H/O Cardiac disease.  On FeSO4 supplement.  Eating an Iron rich diet.  No vaginal bleeding x last visit 5/16th.  No vaginal d/c.  No pelvic pain.  No fever.  Next period expected in about 2 wks.  Long term h/o heavy menses with Hb between 9 and 11 since 2015.  Past medical history,surgical history, problem list, medications, allergies, family history and social history were all reviewed and documented in the EPIC chart.  Directed ROS with pertinent positives and negatives documented in the history of present illness/assessment and plan.  Exam:  Vitals:   05/01/17 1047  BP: 116/78   General appearance:  Normal  Lungs: clear bilaterally Heart:  RCR, no murmur  Speculum:  Cervix post LEEP healing well.  No bleeding, no sign of infection.  LEEP bed closing.  Hb 9.4 on 5/16th  (Hb between 9 and 11 since 2015) Wbc 4.6 on 5/16th  Assessment/Plan:  26 y.o. G0  1. Status post LEEP (loop electrosurgical excision procedure) of cervix Healing well.  No bleeding and no sign of infection.  2. Lightheadedness Secondary to chronic anemia from heavy periods, worsened by further bleeding post LEEP.  3. Iron deficiency anemia due to chronic blood loss Continue on FeSO4 and high Iron diet.  F/U for Nexplanon insertion to decrease blood loss from heavy menses.  Risks/Benefits associated with Nexplanon discussed.  Declines BCPs, Patch, Nuvaring, DepoProvera and Progestin IUD.  Will repeat CBC in 3 months.  Counseling on above issues >50% x 15 minutes.   Genia DelMarie-Lyne Shaivi Rothschild MD, 10:55 AM 05/01/2017

## 2017-05-07 ENCOUNTER — Encounter: Payer: Self-pay | Admitting: Obstetrics & Gynecology

## 2017-05-07 ENCOUNTER — Ambulatory Visit (INDEPENDENT_AMBULATORY_CARE_PROVIDER_SITE_OTHER): Payer: BLUE CROSS/BLUE SHIELD | Admitting: Obstetrics & Gynecology

## 2017-05-07 VITALS — BP 130/82

## 2017-05-07 DIAGNOSIS — Z30017 Encounter for initial prescription of implantable subdermal contraceptive: Secondary | ICD-10-CM

## 2017-05-07 DIAGNOSIS — Z3049 Encounter for surveillance of other contraceptives: Secondary | ICD-10-CM

## 2017-05-07 NOTE — Patient Instructions (Signed)
1. Nexplanon insertion Easy insertion of Nexplanon.  Recommendations and precautions discussed.  Good to see you today!  Congrats on your coming up graduation!

## 2017-05-07 NOTE — Progress Notes (Signed)
    Misty Ayers 01/26/1991 [id number]        26 y.o.  G0P0000   RP:  Nexplanon insertion  Past medical history,surgical history, problem list, medications, allergies, family history and social history were all reviewed and documented in the EPIC chart.  Directed ROS with pertinent positives and negatives documented in the history of present illness/assessment and plan.  Exam:  Vitals:   05/07/17 1554  BP: 130/82   General appearance:  Normal                                              Nexplanon Procedure Note (insertion)   The patient was laying on her back with her nondominant arm flexed (Right arm) at the elbow and externally rotated. The insertion site was identified as the underside of the nondominant upper arm approximately 8 cm from the medial epicondyle of the humerus. 2 marks were made with a sterile marker: The first marked the spot where the Nexplanon  implant was to be inserted, and a second, marked a spot a few centimeters proximal to the first marke to guide the direction of the insertion. The area was cleansed with Betadine solution. The area was anesthetized with 1% lidocaine  (1 cc)  at the area the injection site and underneath the skin along the planned insertion tunnel. The preloaded disposable Nexplanon was removed from its sterile casing. The applicator was held above the needle at the textured surface area. The transparent protector was removed. With a freehand, the skin was stretched around the insertion site with a thumb and index finger. The skin was then punctured with the tip of the needle angled at 30. The Nexplanon applicator was lowered to a horizontal position. While lifting the skin with the tip of the needle the needle was then slid to its full length. The applicator was kept in sitting position with a needle inserted to its full length. The purple slider was unlocked by pushing it slightly downward. The slider was fully moved back until it stopped. This  allowed the implant to be in the final subdermal position and the needle was locked inside the body of the applicator. The applicator was then removed. Steri-Strips x 3 and a band-aid were applied. A Kerlix wrap was placed which patient is to remove tomorrow. No complications patient tolerated procedure well and was released home with instructions.   Assessment/Plan:  26 y.o. G0P0000   1. Nexplanon insertion Easy insertion of Nexplanon.  Recommendations and precautions discussed.  Genia DelMarie-Lyne Rube Sanchez MD, 4:43 PM 05/07/2017

## 2017-05-08 ENCOUNTER — Encounter: Payer: Self-pay | Admitting: Anesthesiology

## 2017-05-15 ENCOUNTER — Ambulatory Visit: Payer: BLUE CROSS/BLUE SHIELD | Admitting: Obstetrics & Gynecology

## 2017-07-22 ENCOUNTER — Ambulatory Visit: Payer: BLUE CROSS/BLUE SHIELD | Admitting: Obstetrics & Gynecology

## 2017-08-12 ENCOUNTER — Ambulatory Visit (INDEPENDENT_AMBULATORY_CARE_PROVIDER_SITE_OTHER): Payer: BLUE CROSS/BLUE SHIELD | Admitting: Physician Assistant

## 2017-08-12 ENCOUNTER — Encounter: Payer: Self-pay | Admitting: Physician Assistant

## 2017-08-12 VITALS — BP 119/75 | HR 93 | Temp 98.7°F | Resp 18 | Ht 64.17 in | Wt 115.4 lb

## 2017-08-12 DIAGNOSIS — Z8639 Personal history of other endocrine, nutritional and metabolic disease: Secondary | ICD-10-CM

## 2017-08-12 DIAGNOSIS — Z23 Encounter for immunization: Secondary | ICD-10-CM

## 2017-08-12 DIAGNOSIS — F902 Attention-deficit hyperactivity disorder, combined type: Secondary | ICD-10-CM | POA: Diagnosis not present

## 2017-08-12 MED ORDER — AMPHET-DEXTROAMPHET 3-BEAD ER 25 MG PO CP24: 25.0000 mg | ORAL_CAPSULE | ORAL | 0 refills | Status: DC

## 2017-08-12 NOTE — Progress Notes (Signed)
PRIMARY CARE AT [facility] [address] [place] [address] [phone]  Date:  08/12/2017   Name:  Misty Ayers   DOB:  [birth year]   MRN:  [id number]  PCP:  Patient, No Pcp Per    History of Present Illness:  Misty Ayers is a 26 y.o. female patient who presents to PCP with  Chief Complaint  Patient presents with  . Medication Refill    Ritalin LA 40 mg and Ritalin 10mg      Without the ritalin for the las She is working, and she wishes to have it when she is OR.  2 weeks, were classes.  She could not concentrate, and felt fidgety.  Someone noticed that she was tapping, and she could tell that she had difficulty with the concentration.   She feels that she has difficulty with the focusing.  She feels that she will break sterility.  The ritalin 10 mg lasted about 3.5 hours.  She is in the OR for about 8 hours, but can maximize to 12-14 hours.    Patient also has a history of iron deficiency.  Has not had any significant cravings of ice, etc.  No palpitations, chest pains, sob, or abnormal fatigue.  There are no active problems to display for this patient.   Past Medical History:  Diagnosis Date  . Anemia   . Anxiety   . Depression     History reviewed. No pertinent surgical history.  Social History  Substance Use Topics  . Smoking status: Never Smoker  . Smokeless tobacco: Never Used  . Alcohol use Yes     Comment: 3-5    Family History  Problem Relation Age of Onset  . Stroke Mother   . Hyperlipidemia Mother   . Hypertension Mother   . Mental illness Mother   . Stroke Brother   . Hypertension Father     Allergies  Allergen Reactions  . Penicillins     unknown    Medication list has been reviewed and updated.  Current Outpatient Prescriptions on File Prior to Visit  Medication Sig Dispense Refill  . methylphenidate (RITALIN LA) 40 MG 24 hr capsule Take 1 capsule (40 mg total) by mouth every morning. Fill after 60 days 30 capsule 0  .  methylphenidate (RITALIN) 10 MG tablet Take 0.5 tablets (5 mg total) by mouth 2 (two) times daily as needed. Fill after 60 days 30 tablet 0  . ferrous sulfate 325 (65 FE) MG EC tablet Take 325 mg by mouth 3 (three) times daily with meals.     No current facility-administered medications on file prior to visit.     ROS ROS otherwise unremarkable unless listed above.  Physical Examination: BP 119/75 (BP Location: Left Arm, Patient Position: Sitting, Cuff Size: Normal)   Pulse 93   Temp 98.7 F (37.1 C) (Oral)   Resp 18   Ht 5' 4.17" (1.63 m)   Wt 115 lb 6.4 oz (52.3 kg)   BMI 19.70 kg/m  Ideal Body Weight: Weight in (lb) to have BMI = 25: 146.1  Physical Exam  Constitutional: She is oriented to person, place, and time. She appears well-developed and well-nourished. No distress.  HENT:  Head: Normocephalic and atraumatic.  Right Ear: External ear normal.  Left Ear: External ear normal.  Eyes: Pupils are equal, round, and reactive to light. Conjunctivae and EOM are normal.  Cardiovascular: Normal rate.   Pulmonary/Chest: Effort normal. No respiratory distress.  Neurological: She is alert and  oriented to person, place, and time.  Skin: She is not diaphoretic.  Psychiatric: She has a normal mood and affect. Her behavior is normal.     Assessment and Plan: Misty Ayers is a 26 y.o. female who is here today for medicaRich Reiningtion refill of the ADHD medication We will try something that may offer lengthier duration.  Maydays given today.  Follow up in 4 weeks We may increase in 1-2 weeks to 37.5 if necessary, but this may offer enough for her job. Attention deficit hyperactivity disorder (ADHD), combined type - Plan: Amphet-Dextroamphet 3-Bead ER (MYDAYIS) 25 MG CP24  Need for immunization against influenza - Plan: Flu Vaccine QUAD 36+ mos IM  History of iron deficiency - Plan: CBC  Misty PlattStephanie Alfred Harrel, PA-C [facility] 9/9/20187:39  PM

## 2017-08-12 NOTE — Patient Instructions (Addendum)
Please try one tablet once per day.   This can be poured out into orange juice, water, or yogurt..  I will likely call you in 1 week to see how this is.   Amphetamine; Dextroamphetamine extended-release capsules What is this medicine? AMPHETAMINE; DEXTROAMPHETAMINE (am FET a meen; dex troe am FET a meen) is used to treat attention-deficit hyperactivity disorder (ADHD). Federal law prohibits giving this medicine to any person other than the person for whom it was prescribed. Do not share this medicine with anyone else. This medicine may be used for other purposes; ask your health care provider or pharmacist if you have questions. COMMON BRAND NAME(S): Adderall XR, Mydayis What should I tell my health care provider before I take this medicine? They need to know if you have any of these conditions: -anxiety or panic attacks -circulation problems in fingers and toes -glaucoma -hardening or blockages of the arteries or heart blood vessels -heart disease or a heart defect -high blood pressure -history of a drug or alcohol abuse problem -history of stroke -kidney disease -liver disease -mental illness -seizures -suicidal thoughts, plans, or attempt; a previous suicide attempt by you or a family member -thyroid disease -Tourette's syndrome -an unusual or allergic reaction to dextroamphetamine, other amphetamines, other medicines, foods, dyes, or preservatives -pregnant or trying to get pregnant -breast-feeding How should I use this medicine? Take this medicine by mouth with a glass of water. Follow the directions on the prescription label. This medicine is taken just one time per day, usually in the morning after waking up. Take with or without food. Do not chew or crush this medicine. You may open the capsules and sprinkle the medicine on a spoonful of applesauce. If sprinkled on applesauce, take the dose immediately and do not crush or chew. Always drink a glass of water or other liquid  after taking this medicine. Do not take your medicine more often than directed. A special MedGuide will be given to you by the pharmacist with each prescription and refill. Be sure to read this information carefully each time. Talk to your pediatrician regarding the use of this medicine in children. While this drug may be prescribed for children as young as 6 years for selected conditions, precautions do apply. Overdosage: If you think you have taken too much of this medicine contact a poison control center or emergency room at once. NOTE: This medicine is only for you. Do not share this medicine with others. What if I miss a dose? If you miss a dose, take it as soon as you can. If it is almost time for your next dose, take only that dose. Do not take double or extra doses. What may interact with this medicine? Do not take this medicine with any of the following medications: -MAOIs like Carbex, Eldepryl, Marplan, Nardil, and Parnate -other stimulant medicines for attention disorders, weight loss, or to stay awake This medicine may also interact with the following medications: -acetazolamide -ammonium chloride -antacids -ascorbic acid -atomoxetine -caffeine -certain medicines for blood pressure -certain medicines for depression, anxiety, or psychotic disturbances -certain medicines for diabetes -certain medicines for seizures like carbamazepine, phenobarbital, phenytoin -certain medicines for stomach problems like cimetidine, famotidine, omeprazole, lansoprazole -cold or allergy medicines -glutamic acid -lithium -meperidine -methenamine; sodium acid phosphate -narcotic medicines for pain -norepinephrine -phenothiazines like chlorpromazine, mesoridazine, prochlorperazine, thioridazine -sodium bicarbonate This list may not describe all possible interactions. Give your health care provider a list of all the medicines, herbs, non-prescription drugs, or dietary supplements you  use. Also  tell them if you smoke, drink alcohol, or use illegal drugs. Some items may interact with your medicine. What should I watch for while using this medicine? Visit your doctor or health care professional for regular checks on your progress. This prescription requires that you follow special procedures with your doctor and pharmacy. You will need to have a new written prescription from your doctor every time you need a refill. This medicine may affect your concentration, or hide signs of tiredness. Until you know how this medicine affects you, do not drive, ride a bicycle, use machinery, or do anything that needs mental alertness. Tell your doctor or health care professional if this medicine loses its effects, or if you feel you need to take more than the prescribed amount. Do not change the dosage without talking to your doctor or health care professional. Decreased appetite is a common side effect when starting this medicine. Eating small, frequent meals or snacks can help. Talk to your doctor if you continue to have poor eating habits. Height and weight growth of a child taking this medicine will be monitored closely. Do not take this medicine close to bedtime. It may prevent you from sleeping. If you are going to need surgery, an MRI, a CT scan, or other procedure, tell your doctor that you are taking this medicine. You may need to stop taking this medicine before the procedure. Tell your doctor or healthcare professional right away if you notice unexplained wounds on your fingers and toes while taking this medicine. You should also tell your healthcare provider if you experience numbness or pain, changes in the skin color, or sensitivity to temperature in your fingers or toes. What side effects may I notice from receiving this medicine? Side effects that you should report to your doctor or health care professional as soon as possible: -allergic reactions like skin rash, itching or hives, swelling of the  face, lips, or tongue -changes in vision -chest pain or chest tightness -confusion, trouble speaking or understanding -fast, irregular heartbeat -fingers or toes feel numb, cool, painful -hallucination, loss of contact with reality -high blood pressure -males: prolonged or painful erection -seizures -severe headaches -shortness of breath -suicidal thoughts or other mood changes -trouble walking, dizziness, loss of balance or coordination -uncontrollable head, mouth, neck, arm, or leg movements Side effects that usually do not require medical attention (report to your doctor or health care professional if they continue or are bothersome): -anxious -headache -loss of appetite -nausea, vomiting -trouble sleeping -weight loss This list may not describe all possible side effects. Call your doctor for medical advice about side effects. You may report side effects to FDA at 1-800-FDA-1088. Where should I keep my medicine? Keep out of the reach of children. This medicine can be abused. Keep your medicine in a safe place to protect it from theft. Do not share this medicine with anyone. Selling or giving away this medicine is dangerous and against the law. Store at room temperature between 15 and 30 degrees C (59 and 86 degrees F). Keep container tightly closed. Protect from light. Throw away any unused medicine after the expiration date. NOTE: This sheet is a summary. It may not cover all possible information. If you have questions about this medicine, talk to your doctor, pharmacist, or health care provider.  2018 Elsevier/Gold Standard (2014-09-27 18:22:45)    IF you received an x-ray today, you will receive an invoice from [facility]. Please contact [facility] at [phone] with questions  or concerns regarding your invoice.   IF you received labwork today, you will receive an invoice from [facility]. Please contact [facility] at [phone] with questions or concerns  regarding your invoice.   Our billing staff will not be able to assist you with questions regarding bills from these companies.  You will be contacted with the lab results as soon as they are available. The fastest way to get your results is to activate your My Chart account. Instructions are located on the last page of this paperwork. If you have not heard from [place] regarding the results in 2 weeks, please contact this office.

## 2017-08-13 LAB — CBC
Hematocrit: 33.9 % — ABNORMAL LOW (ref 34.0–46.6)
Hemoglobin: 10.5 g/dL — ABNORMAL LOW (ref 11.1–15.9)
MCH: 24.8 pg — AB (ref 26.6–33.0)
MCHC: 31 g/dL — AB (ref 31.5–35.7)
MCV: 80 fL (ref 79–97)
PLATELETS: 273 10*3/uL (ref 150–379)
RBC: 4.24 x10E6/uL (ref 3.77–5.28)
RDW: 18 % — AB (ref 12.3–15.4)
WBC: 4.8 10*3/uL (ref 3.4–10.8)

## 2017-08-25 ENCOUNTER — Ambulatory Visit (INDEPENDENT_AMBULATORY_CARE_PROVIDER_SITE_OTHER): Payer: BLUE CROSS/BLUE SHIELD | Admitting: Obstetrics & Gynecology

## 2017-08-25 ENCOUNTER — Encounter: Payer: Self-pay | Admitting: Obstetrics & Gynecology

## 2017-08-25 VITALS — BP 112/70

## 2017-08-25 DIAGNOSIS — D069 Carcinoma in situ of cervix, unspecified: Secondary | ICD-10-CM | POA: Diagnosis not present

## 2017-08-25 DIAGNOSIS — Z9889 Other specified postprocedural states: Secondary | ICD-10-CM

## 2017-08-25 NOTE — Patient Instructions (Signed)
1. Severe dysplasia of cervix (CIN III) LEEP margins negative for HGSIL, but LGSIL not excluded.  HPV 16-18-45 neg.  F/U 4 month Colpo today.  Probably CIN1.  Awaiting patho on cervical Bx.  Recommend Folic Acid supplement.   2. H/O LEEP 03/2017  Good to see you today Misty Ayers!  I will inform you of the biopsy result as soon as available.

## 2017-08-25 NOTE — Progress Notes (Signed)
    Misty Ayers 10/29/1991 [id number]        26 y.o.  G0  RP:  F/U Colpo 4 months post LEEP CIN3 neg margin  HPI:  LEEP showed CIN3 with no HGSIL at margins, but LGSIL not excluded.  HPV 16-18-45 neg.  Past medical history,surgical history, problem list, medications, allergies, family history and social history were all reviewed and documented in the EPIC chart.  Directed ROS with pertinent positives and negatives documented in the history of present illness/assessment and plan.  Exam:  Vitals:   08/25/17 1634  BP: 112/70   General appearance:  Normal  Colposcopy Procedure Note Misty Ayers 08/25/2017  Indications:  LEEP CIN 3 with negative margins (CIN 1 at margin not excluded)  Procedure Details  The risks and benefits of the procedure and Verbal informed consent obtained.  Speculum placed in vagina and excellent visualization of cervix achieved, cervix swabbed x 3 with acetic acid solution.  Findings:  Cervix colposcopy: Physical Exam  Genitourinary:      Vaginal colposcopy:  Normal  Vulvar colposcopy: Grossly normal  Perirectal colposcopy: Grossly normal   Specimens:  Cervical Bx at 1 O'clock.  Complications:  None, good hemostasis with Silver Nitrate. .  Plan:  Pending Cervical Bx, management per result.   Assessment/Plan:  27 y.o. G0  1. Severe dysplasia of cervix (CIN III) LEEP margins negative for HGSIL, but LGSIL not excluded.  HPV 16-18-45 neg.  F/U 4 month Colpo today.  Probably CIN1.  Awaiting patho on cervical Bx.  Recommend Folic Acid supplement.   2. H/O LEEP 03/2017  [name], 4:41 PM 08/25/2017

## 2017-08-27 LAB — TISSUE SPECIMEN

## 2017-08-27 LAB — PATHOLOGY

## 2017-09-02 ENCOUNTER — Encounter: Payer: Self-pay | Admitting: *Deleted

## 2017-09-23 ENCOUNTER — Encounter: Payer: Self-pay | Admitting: Physician Assistant

## 2017-10-05 ENCOUNTER — Ambulatory Visit: Payer: BLUE CROSS/BLUE SHIELD | Admitting: Physician Assistant

## 2017-10-05 ENCOUNTER — Other Ambulatory Visit: Payer: Self-pay | Admitting: Physician Assistant

## 2017-10-05 DIAGNOSIS — F909 Attention-deficit hyperactivity disorder, unspecified type: Secondary | ICD-10-CM

## 2017-10-05 MED ORDER — METHYLPHENIDATE HCL ER (LA) 40 MG PO CP24: 40.0000 mg | ORAL_CAPSULE | ORAL | 0 refills | Status: DC

## 2017-10-05 MED ORDER — AMPHETAMINE-DEXTROAMPHET ER 30 MG PO CP24: 30.0000 mg | ORAL_CAPSULE | Freq: Every day | ORAL | 0 refills | Status: DC

## 2017-10-05 MED ORDER — AMPHETAMINE-DEXTROAMPHET ER 30 MG PO CP24: 30.0000 mg | ORAL_CAPSULE | ORAL | 0 refills | Status: DC

## 2017-10-05 NOTE — Progress Notes (Unsigned)
[facility] [address], [place] [id number] [phone]  Date:  10/05/2017   Name:  Ernest MallickChristele M Kidibu   DOB:  01/06/1991   MRN:  [id number]  PCP:  Garnetta BuddyEnglish, Kimber Esterly D, PA    History of Present Illness:  Berneta Rich ReiningM Kidibu is a 26 y.o. female patient who presents to PCP with No chief complaint on file.      There are no active problems to display for this patient.   Past Medical History:  Diagnosis Date  . Anemia   . Anxiety   . Depression     No past surgical history on file.  Social History  Substance Use Topics  . Smoking status: Never Smoker  . Smokeless tobacco: Never Used  . Alcohol use Yes     Comment: 3-5    Family History  Problem Relation Age of Onset  . Stroke Mother   . Hyperlipidemia Mother   . Hypertension Mother   . Mental illness Mother   . Stroke Brother   . Hypertension Father     Allergies  Allergen Reactions  . Penicillins     unknown    Medication list has been reviewed and updated.  Current Outpatient Prescriptions on File Prior to Visit  Medication Sig Dispense Refill  . Amphet-Dextroamphet 3-Bead ER (MYDAYIS) 25 MG CP24 Take 25 mg by mouth every morning. 30 capsule 0  . ferrous sulfate 325 (65 FE) MG EC tablet Take 325 mg by mouth 3 (three) times daily with meals.    . methylphenidate (RITALIN) 10 MG tablet Take 0.5 tablets (5 mg total) by mouth 2 (two) times daily as needed. Fill after 60 days 30 tablet 0   No current facility-administered medications on file prior to visit.     ROS ROS otherwise unremarkable unless listed above.  Physical Examination: There were no vitals taken for this visit. Ideal Body Weight:    Physical Exam   Assessment and Plan: Sneha Rich ReiningM Kidibu is a 26 y.o. female who is here today  1. Attention deficit hyperactivity disorder (ADHD), unspecified ADHD type *** - methylphenidate (RITALIN LA) 40 MG 24 hr capsule; Take 1 capsule (40 mg total) by mouth every morning. Fill after  60 days  Dispense: 30 capsule; Refill: 0   Trena PlattStephanie Cynthya Yam, PA-C [facility] 10/05/2017 3:25 PM

## 2018-01-06 ENCOUNTER — Encounter: Payer: Self-pay | Admitting: Physician Assistant

## 2018-01-06 ENCOUNTER — Ambulatory Visit: Payer: BLUE CROSS/BLUE SHIELD | Admitting: Physician Assistant

## 2018-01-06 VITALS — BP 122/82 | HR 89 | Temp 97.7°F | Resp 16 | Ht 64.0 in | Wt 107.4 lb

## 2018-01-06 DIAGNOSIS — F909 Attention-deficit hyperactivity disorder, unspecified type: Secondary | ICD-10-CM | POA: Diagnosis not present

## 2018-01-06 DIAGNOSIS — R21 Rash and other nonspecific skin eruption: Secondary | ICD-10-CM

## 2018-01-06 DIAGNOSIS — D509 Iron deficiency anemia, unspecified: Secondary | ICD-10-CM | POA: Diagnosis not present

## 2018-01-06 DIAGNOSIS — R4689 Other symptoms and signs involving appearance and behavior: Secondary | ICD-10-CM | POA: Diagnosis not present

## 2018-01-06 MED ORDER — AMPHETAMINE-DEXTROAMPHETAMINE 10 MG PO TABS: 10.0000 mg | ORAL_TABLET | Freq: Every day | ORAL | 0 refills | Status: DC | PRN

## 2018-01-06 MED ORDER — AMPHETAMINE-DEXTROAMPHET ER 30 MG PO CP24: 30.0000 mg | ORAL_CAPSULE | Freq: Every day | ORAL | 0 refills | Status: DC

## 2018-01-06 MED ORDER — FLUOXETINE HCL 20 MG PO TABS: 20.0000 mg | ORAL_TABLET | Freq: Every day | ORAL | 1 refills | Status: DC

## 2018-01-06 MED ORDER — TRIAMCINOLONE ACETONIDE 0.1 % EX CREA: 1.0000 "application " | TOPICAL_CREAM | Freq: Two times a day (BID) | CUTANEOUS | 0 refills | Status: DC

## 2018-01-06 NOTE — Patient Instructions (Addendum)
So we will start the prozac at half tablet first week, then increase to the full tablet Please manage less prn shifts as we discussed. Follow up in 5 weeks.  After the skin heals, I would like you to try the triamcinolone cream.  No longer than 2 weeks.  Make sure you moisturize normal.  And place a bandage on the areas to prevent scratching.  Stress and Stress Management Stress is a normal reaction to life events. It is what you feel when life demands more than you are used to or more than you can handle. Some stress can be useful. For example, the stress reaction can help you catch the last bus of the day, study for a test, or meet a deadline at work. But stress that occurs too often or for too long can cause problems. It can affect your emotional health and interfere with relationships and normal daily activities. Too much stress can weaken your immune system and increase your risk for physical illness. If you already have a medical problem, stress can make it worse. What are the causes? All sorts of life events may cause stress. An event that causes stress for one person may not be stressful for another person. Major life events commonly cause stress. These may be positive or negative. Examples include losing your job, moving into a new home, getting married, having a baby, or losing a loved one. Less obvious life events may also cause stress, especially if they occur day after day or in combination. Examples include working long hours, driving in traffic, caring for children, being in debt, or being in a difficult relationship. What are the signs or symptoms? Stress may cause emotional symptoms including, the following:  Anxiety. This is feeling worried, afraid, on edge, overwhelmed, or out of control.  Anger. This is feeling irritated or impatient.  Depression. This is feeling sad, down, helpless, or guilty.  Difficulty focusing, remembering, or making decisions.  Stress may cause physical  symptoms, including the following:  Aches and pains. These may affect your head, neck, back, stomach, or other areas of your body.  Tight muscles or clenched jaw.  Low energy or trouble sleeping.  Stress may cause unhealthy behaviors, including the following:  Eating to feel better (overeating) or skipping meals.  Sleeping too little, too much, or both.  Working too much or putting off tasks (procrastination).  Smoking, drinking alcohol, or using drugs to feel better.  How is this diagnosed? Stress is diagnosed through an assessment by your health care provider. Your health care provider will ask questions about your symptoms and any stressful life events.Your health care provider will also ask about your medical history and may order blood tests or other tests. Certain medical conditions and medicine can cause physical symptoms similar to stress. Mental illness can cause emotional symptoms and unhealthy behaviors similar to stress. Your health care provider may refer you to a mental health professional for further evaluation. How is this treated? Stress management is the recommended treatment for stress.The goals of stress management are reducing stressful life events and coping with stress in healthy ways. Techniques for reducing stressful life events include the following:  Stress identification. Self-monitor for stress and identify what causes stress for you. These skills may help you to avoid some stressful events.  Time management. Set your priorities, keep a calendar of events, and learn to say "no." These tools can help you avoid making too many commitments.  Techniques for coping with stress include  the following:  Rethinking the problem. Try to think realistically about stressful events rather than ignoring them or overreacting. Try to find the positives in a stressful situation rather than focusing on the negatives.  Exercise. Physical exercise can release both physical  and emotional tension. The key is to find a form of exercise you enjoy and do it regularly.  Relaxation techniques. These relax the body and mind. Examples include yoga, meditation, tai chi, biofeedback, deep breathing, progressive muscle relaxation, listening to music, being out in nature, journaling, and other hobbies. Again, the key is to find one or more that you enjoy and can do regularly.  Healthy lifestyle. Eat a balanced diet, get plenty of sleep, and do not smoke. Avoid using alcohol or drugs to relax.  Strong support network. Spend time with family, friends, or other people you enjoy being around.Express your feelings and talk things over with someone you trust.  Counseling or talktherapy with a mental health professional may be helpful if you are having difficulty managing stress on your own. Medicine is typically not recommended for the treatment of stress.Talk to your health care provider if you think you need medicine for symptoms of stress. Follow these instructions at home:  Keep all follow-up visits as directed by your health care provider.  Take all medicines as directed by your health care provider. Contact a health care provider if:  Your symptoms get worse or you start having new symptoms.  You feel overwhelmed by your problems and can no longer manage them on your own. Get help right away if:  You feel like hurting yourself or someone else. This information is not intended to replace advice given to you by your health care provider. Make sure you discuss any questions you have with your health care provider. Document Released: 05/20/2001 Document Revised: 05/01/2016 Document Reviewed: 07/19/2013 Elsevier Interactive Patient Education  2017 [employer].     IF you received an x-ray today, you will receive an invoice from [facility]. Please contact [facility] at [phone] with questions or concerns regarding your invoice.   IF you received  labwork today, you will receive an invoice from [facility]. Please contact [facility] at [phone] with questions or concerns regarding your invoice.   Our billing staff will not be able to assist you with questions regarding bills from these companies.  You will be contacted with the lab results as soon as they are available. The fastest way to get your results is to activate your My Chart account. Instructions are located on the last page of this paperwork. If you have not heard from [place] regarding the results in 2 weeks, please contact this office.

## 2018-01-06 NOTE — Progress Notes (Signed)
PRIMARY CARE AT [facility] [address], [place] [address] [phone]  Date:  01/06/2018   Name:  Misty Ayers   DOB:  Aug 15, 1991   MRN:  [id number]  PCP:  Garnetta Buddy, PA    History of Present Illness:  Misty Ayers is a 27 y.o. female patient who presents to PCP with  Chief Complaint  Patient presents with  . Medication Refill    Adderall, ritalin      She has noted an explosive sense of irritation with anger.  Notes feeling like lashing out very often.  She has a lot of anxiety.   She notes that she has taken the medication as prescribed of the adderall. She is under stress with working in the OR, and works part-time at her prior clinic.  She feels very stressed and is putting in a lot of hours, due to a shortage of employees of the prior clinic.   She has no si/hi. She has had a hx of anxiety.  She did notice improvement of her symptoms but did not want to continue taking medications.   There are no active problems to display for this patient.   Past Medical History:  Diagnosis Date  . Anemia   . Anxiety   . Depression     No past surgical history on file.  Social History   Tobacco Use  . Smoking status: Never Smoker  . Smokeless tobacco: Never Used  Substance Use Topics  . Alcohol use: Yes    Comment: 3-5  . Drug use: No    Family History  Problem Relation Age of Onset  . Stroke Mother   . Hyperlipidemia Mother   . Hypertension Mother   . Mental illness Mother   . Stroke Brother   . Hypertension Father     Allergies  Allergen Reactions  . Penicillins     unknown    Medication list has been reviewed and updated.  Current Outpatient Medications on File Prior to Visit  Medication Sig Dispense Refill  . amphetamine-dextroamphetamine (ADDERALL XR) 30 MG 24 hr capsule Take 1 capsule (30 mg total) by mouth daily. 30 capsule 0  . ferrous sulfate 325 (65 FE) MG EC tablet Take 325 mg by mouth 3 (three) times daily with meals.    .  methylphenidate (RITALIN) 10 MG tablet Take 0.5 tablets (5 mg total) by mouth 2 (two) times daily as needed. Fill after 60 days 30 tablet 0  . amphetamine-dextroamphetamine (ADDERALL XR) 30 MG 24 hr capsule Take 1 capsule (30 mg total) by mouth every morning. Fill after 30 days (Patient not taking: Reported on 01/06/2018) 30 capsule 0   No current facility-administered medications on file prior to visit.     ROS ROS otherwise unremarkable unless listed above.  Physical Examination: BP 122/82   Pulse 89   Temp 97.7 F (36.5 C) (Oral)   Resp 16   Ht 5\' 4"  (1.626 m)   Wt 107 lb 6.4 oz (48.7 kg)   SpO2 100%   BMI 18.44 kg/m  Ideal Body Weight: Weight in (lb) to have BMI = 25: 145.3  Physical Exam  Constitutional: She is oriented to person, place, and time. She appears well-developed and well-nourished. No distress.  HENT:  Head: Normocephalic and atraumatic.  Right Ear: External ear normal.  Left Ear: External ear normal.  Eyes: Conjunctivae and EOM are normal. Pupils are equal, round, and reactive to light.  Neck: No thyroid mass and no  thyromegaly present.  Cardiovascular: Normal rate, regular rhythm and normal heart sounds.  No murmur heard. Pulmonary/Chest: Effort normal. No respiratory distress.  Neurological: She is alert and oriented to person, place, and time.  Skin: Skin is warm and dry. Capillary refill takes less than 2 seconds. She is not diaphoretic.  Lateral flexor area of anterior elbow with excoriation  Psychiatric: She has a normal mood and affect. Her behavior is normal.     Assessment and Plan: Misty Ayers is a 27 y.o. female who is here today for cc of  Chief Complaint  Patient presents with  . Medication Refill    Adderall, ritalin  will recheck her thyroid and anemia, which may contribute to her anxiety.   Starting Prozac.  Half tablet for 1 week then increasing to the full 20 mg.   Follow up in 5 weeks after optimal level of prozac. Advised to  put bandages over the rash to prevent picking. Advised daily moisturizing.  Given triamcinolone to apply after healing Iron deficiency anemia, unspecified iron deficiency anemia type - Plan: CBC  Behavioral change - Plan: TSH, FLUoxetine (PROZAC) 20 MG tablet  Attention deficit hyperactivity disorder (ADHD), unspecified ADHD type - Plan: amphetamine-dextroamphetamine (ADDERALL) 10 MG tablet, amphetamine-dextroamphetamine (ADDERALL XR) 30 MG 24 hr capsule  Rash and nonspecific skin eruption - Plan: triamcinolone cream (KENALOG) 0.1 %  Trena PlattStephanie English, PA-C [facility] 2/8/201912:46 PM

## 2018-01-07 LAB — TSH: TSH: 0.827 u[IU]/mL (ref 0.450–4.500)

## 2018-01-07 LAB — CBC
HEMATOCRIT: 36.6 % (ref 34.0–46.6)
Hemoglobin: 11.7 g/dL (ref 11.1–15.9)
MCH: 26.5 pg — ABNORMAL LOW (ref 26.6–33.0)
MCHC: 32 g/dL (ref 31.5–35.7)
MCV: 83 fL (ref 79–97)
PLATELETS: 310 10*3/uL (ref 150–379)
RBC: 4.41 x10E6/uL (ref 3.77–5.28)
RDW: 14.5 % (ref 12.3–15.4)
WBC: 5 10*3/uL (ref 3.4–10.8)

## 2018-01-13 ENCOUNTER — Encounter (HOSPITAL_COMMUNITY): Payer: Self-pay | Admitting: Emergency Medicine

## 2018-01-13 ENCOUNTER — Emergency Department (HOSPITAL_COMMUNITY): Payer: BLUE CROSS/BLUE SHIELD

## 2018-01-13 ENCOUNTER — Encounter: Payer: Self-pay | Admitting: Physician Assistant

## 2018-01-13 ENCOUNTER — Ambulatory Visit: Payer: Self-pay

## 2018-01-13 DIAGNOSIS — R079 Chest pain, unspecified: Secondary | ICD-10-CM | POA: Insufficient documentation

## 2018-01-13 DIAGNOSIS — Z5321 Procedure and treatment not carried out due to patient leaving prior to being seen by health care provider: Secondary | ICD-10-CM | POA: Diagnosis not present

## 2018-01-13 LAB — I-STAT TROPONIN, ED: TROPONIN I, POC: 0 ng/mL (ref 0.00–0.08)

## 2018-01-13 LAB — CBC
HEMATOCRIT: 37.3 % (ref 36.0–46.0)
Hemoglobin: 12 g/dL (ref 12.0–15.0)
MCH: 26.1 pg (ref 26.0–34.0)
MCHC: 32.2 g/dL (ref 30.0–36.0)
MCV: 81.3 fL (ref 78.0–100.0)
PLATELETS: 285 10*3/uL (ref 150–400)
RBC: 4.59 MIL/uL (ref 3.87–5.11)
RDW: 14.5 % (ref 11.5–15.5)
WBC: 7.4 10*3/uL (ref 4.0–10.5)

## 2018-01-13 LAB — COMPREHENSIVE METABOLIC PANEL
ALBUMIN: 4.6 g/dL (ref 3.5–5.0)
ALT: 14 U/L (ref 14–54)
AST: 24 U/L (ref 15–41)
Alkaline Phosphatase: 52 U/L (ref 38–126)
Anion gap: 13 (ref 5–15)
BUN: 9 mg/dL (ref 6–20)
CHLORIDE: 105 mmol/L (ref 101–111)
CO2: 22 mmol/L (ref 22–32)
Calcium: 9.8 mg/dL (ref 8.9–10.3)
Creatinine, Ser: 0.89 mg/dL (ref 0.44–1.00)
GFR calc Af Amer: >60 mL/min (ref >60)
GLUCOSE: 112 mg/dL — AB (ref 65–99)
Potassium: 3.5 mmol/L (ref 3.5–5.1)
SODIUM: 140 mmol/L (ref 135–145)
Total Bilirubin: 1.2 mg/dL (ref 0.3–1.2)
Total Protein: 8.1 g/dL (ref 6.5–8.1)

## 2018-01-13 LAB — URINALYSIS, ROUTINE W REFLEX MICROSCOPIC
BACTERIA UA: NONE SEEN
BILIRUBIN URINE: NEGATIVE
Glucose, UA: NEGATIVE mg/dL
Hgb urine dipstick: NEGATIVE
KETONES UR: 5 mg/dL — AB
LEUKOCYTES UA: NEGATIVE
Nitrite: NEGATIVE
Protein, ur: 30 mg/dL — AB
SPECIFIC GRAVITY, URINE: 1.032 — AB (ref 1.005–1.030)
pH: 7 (ref 5.0–8.0)

## 2018-01-13 LAB — LIPASE, BLOOD: LIPASE: 29 U/L (ref 11–51)

## 2018-01-13 LAB — I-STAT BETA HCG BLOOD, ED (MC, WL, AP ONLY)

## 2018-01-13 NOTE — ED Triage Notes (Signed)
Pt reports weakness, dizziness, abd pain, N/V, chest pain onset this afternoon. Pt states she is a Engineer, civil (consulting)nurse, called the urgent care and they informed her to come here

## 2018-01-13 NOTE — Telephone Encounter (Signed)
Patient called in with c/o "abdominal pain." She says "earlier today at work, I felt dizzy, so I went to sit down. I went to eat thinking that I needed to eat something. I took one bite of food and I had projectile vomiting once. I came home and got in the bed. My abdomen progressively got worse as the day went on and now it hurts about a 5-6 without me moving. The pain is across the lower part of my abdomen on both sides and groin, then goes down my left leg."  She says she was constipated on Monday, but now she's not. She says she hasn't felt that great all week and her appetite has been down, but the pain started today. According to the protocol, go to ED. At first patient wanted to know if there was another option, I advised that by going to the ED, tests can be run to see what is causing the abdominal pain. She agreed to go to the ED and said someone is there to drive her. Care advice given, she verbalized understanding.  Reason for Disposition . [1] SEVERE pain (e.g., excruciating) AND [2] present > 1 hour  Answer Assessment - Initial Assessment Questions 1. LOCATION: "Where does it hurt?"      Lower left/right abdomen  2. RADIATION: "Does the pain shoot anywhere else?" (e.g., chest, back)     Down left leg, both groin 3. ONSET: "When did the pain begin?" (e.g., minutes, hours or days ago)      Today 4. SUDDEN: "Gradual or sudden onset?"     Gradually worse over the day 5. PATTERN "Does the pain come and go, or is it constant?"    - If constant: "Is it getting better, staying the same, or worsening?"      (Note: Constant means the pain never goes away completely; most serious pain is constant and it progresses)     - If intermittent: "How long does it last?" "Do you have pain now?"     (Note: Intermittent means the pain goes away completely between bouts)     Constant 6. SEVERITY: "How bad is the pain?"  (e.g., Scale 1-10; mild, moderate, or severe)   - MILD (1-3): doesn't interfere with  normal activities, abdomen soft and not tender to touch    - MODERATE (4-7): interferes with normal activities or awakens from sleep, tender to touch    - SEVERE (8-10): excruciating pain, doubled over, unable to do any normal activities      5-6 depending on movement 7. RECURRENT SYMPTOM: "Have you ever had this type of abdominal pain before?" If so, ask: "When was the last time?" and "What happened that time?"      No, other than menstrual cramps 8. CAUSE: "What do you think is causing the abdominal pain?"     Unknown 9. RELIEVING/AGGRAVATING FACTORS: "What makes it better or worse?" (e.g., movement, antacids, bowel movement)     Lying down 10. OTHER SYMPTOMS: "Has there been any vomiting, diarrhea, constipation, or urine problems?"       Vomiting, constipated Monday 11. PREGNANCY: "Is there any chance you are pregnant?" "When was your last menstrual period?"       No  Protocols used: [facility]

## 2018-01-14 ENCOUNTER — Emergency Department (HOSPITAL_COMMUNITY)
Admission: EM | Admit: 2018-01-14 | Discharge: 2018-01-14 | Discharge status: Against Medical Advice | Payer: BLUE CROSS/BLUE SHIELD | Attending: Emergency Medicine | Admitting: Emergency Medicine

## 2018-01-14 ENCOUNTER — Encounter: Payer: Self-pay | Admitting: Physician Assistant

## 2018-01-14 ENCOUNTER — Telehealth: Payer: Self-pay | Admitting: Physician Assistant

## 2018-01-14 NOTE — ED Notes (Signed)
Called pt.for a room no answer x2

## 2018-01-14 NOTE — Telephone Encounter (Signed)
I am not seeing anything alarming regarding the blood work.  She looks dehydrated.

## 2018-01-14 NOTE — Telephone Encounter (Signed)
Copied from CRM [phone]. Topic: Quick Communication - See Telephone Encounter >> Jan 14, 2018  9:46 AM Jolayne Hainesaylor, Brittany L wrote: CRM for notification. See Telephone encounter for:   01/14/18.  Patient was seen in the ER last night but left after she did her xrays, didn't see the provider. They put her labs in to her chart & she wants to know if Trena PlattStephanie English can review those & give her a plan of care. She said she can come in after you review the labs. Advised she needs to be seen, she said she will wait until she looks at those. Call back [phone]

## 2018-01-14 NOTE — Telephone Encounter (Signed)
Please Advise

## 2018-01-14 NOTE — Telephone Encounter (Signed)
FYI

## 2018-01-15 ENCOUNTER — Encounter: Payer: Self-pay | Admitting: Physician Assistant

## 2018-01-15 NOTE — Telephone Encounter (Signed)
Patient notified of provider's response. She will call if she needs anything else.

## 2018-01-15 NOTE — Telephone Encounter (Signed)
lmvm to call us back CRM [phone]#[id number] entered

## 2018-01-22 ENCOUNTER — Encounter: Payer: Self-pay | Admitting: Physician Assistant

## 2018-01-29 ENCOUNTER — Encounter: Payer: Self-pay | Admitting: Physician Assistant

## 2018-02-08 ENCOUNTER — Ambulatory Visit (INDEPENDENT_AMBULATORY_CARE_PROVIDER_SITE_OTHER): Payer: BLUE CROSS/BLUE SHIELD | Admitting: Physician Assistant

## 2018-02-08 VITALS — BP 127/75 | HR 106 | Temp 98.2°F | Resp 16 | Ht 64.0 in | Wt 105.0 lb

## 2018-02-08 DIAGNOSIS — F909 Attention-deficit hyperactivity disorder, unspecified type: Secondary | ICD-10-CM

## 2018-02-08 DIAGNOSIS — F411 Generalized anxiety disorder: Secondary | ICD-10-CM

## 2018-02-08 NOTE — Patient Instructions (Addendum)
  I am referring you to psychiatrist for consult.   In the meantime, continue to get some of the extra stressors down.     IF you received an x-ray today, you will receive an invoice from [facility]. Please contact [facility] at [phone] with questions or concerns regarding your invoice.   IF you received labwork today, you will receive an invoice from [facility]. Please contact [facility] at [phone] with questions or concerns regarding your invoice.   Our billing staff will not be able to assist you with questions regarding bills from these companies.  You will be contacted with the lab results as soon as they are available. The fastest way to get your results is to activate your My Chart account. Instructions are located on the last page of this paperwork. If you have not heard from us regarding the results in 2 weeks, please contact this office.

## 2018-02-08 NOTE — Progress Notes (Signed)
PRIMARY CARE AT [facility] [address] [place] [address] [phone]  Date:  02/08/2018   Name:  Misty Ayers   DOB:  [birth year]   MRN:  [id number]  PCP:  Garnetta Buddy, PA    History of Present Illness:  Misty Ayers is a 27 y.o. female patient who presents to PCP with  Chief Complaint  Patient presents with  . Medication Management    prozac, pt states she had tremors on this medication     After the prozac, she felt very tremulous with the medication.  She took this for 2 weeks with a half pill.  She felt like a robot with the full tablet, but felt purposeless.  Reports symptoms of easy suicidality if given a cause, when she was on the prozac.   She continues to have the anxiety but has managed to keep this better by voicing her need to have less hours at work.  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  She also works at  Pediatric jobs.  She is cutting her OR hours at this time, though she was initially passionate about going to the OR.     There are no active problems to display for this patient.   Past Medical History:  Diagnosis Date  . Anemia   . Anxiety   . Depression     No past surgical history on file.  Social History   Tobacco Use  . Smoking status: Never Smoker  . Smokeless tobacco: Never Used  Substance Use Topics  . Alcohol use: Yes    Comment: 3-5  . Drug use: No    Family History  Problem Relation Age of Onset  . Stroke Mother   . Hyperlipidemia Mother   . Hypertension Mother   . Mental illness Mother   . Stroke Brother   . Hypertension Father     Allergies  Allergen Reactions  . Penicillins     unknown    Medication list has been reviewed and updated.  Current Outpatient Medications on File Prior to Visit  Medication Sig Dispense Refill  . amphetamine-dextroamphetamine (ADDERALL XR) 30 MG 24 hr capsule Take 1 capsule (30 mg total) by mouth daily. 30 capsule 0   . amphetamine-dextroamphetamine (ADDERALL) 10 MG tablet Take 1 tablet (10 mg total) by mouth daily as needed. 10 tablet 0  . amphetamine-dextroamphetamine (ADDERALL XR) 30 MG 24 hr capsule Take 1 capsule (30 mg total) by mouth every morning. Fill after 30 days (Patient not taking: Reported on 01/06/2018) 30 capsule 0   No current facility-administered medications on file prior to visit.     ROS ROS otherwise unremarkable unless listed above.  Physical Examination: BP 127/75   Pulse (!) 106   Temp 98.2 F (36.8 C) (Oral)   Resp 16   Ht 5\' 4"  (1.626 m)   Wt 105 lb (47.6 kg)   SpO2 100%   BMI 18.02 kg/m  Ideal Body Weight: Weight in (lb) to have BMI = 25: 145.3  Physical Exam  Constitutional: She is oriented to person, place, and time. She appears well-developed and well-nourished. No distress.  HENT:  Head: Normocephalic and atraumatic.  Right Ear: External ear normal.  Left Ear: External ear normal.  Eyes: Conjunctivae and EOM are normal. Pupils are equal, round, and reactive to light.  Cardiovascular: Normal rate.  Pulmonary/Chest: Effort normal. No respiratory distress.  Neurological: She is alert and oriented to  person, place, and time.  Skin: She is not diaphoretic.  Psychiatric: She has a normal mood and affect. Her behavior is normal.     Assessment and Plan: Mili Rich ReiningM Kidibu is a 27 y.o. female who is here today for cc of  Chief Complaint  Patient presents with  . Medication Management    prozac, pt states she had tremors on this medication  --we will refer her to psychiatry at this time.  I am concerned of the diagnosis of adhd, and whether this is anxiety alone.  She is agreeable to this plan, and offering another solution.    Anxiety state - Plan: Ambulatory referral to Psychiatry  Trena PlattStephanie English, PA-C [facility] 3/12/201912:37 PM

## 2018-02-16 ENCOUNTER — Encounter: Payer: Self-pay | Admitting: Physician Assistant

## 2018-02-18 ENCOUNTER — Encounter: Payer: Self-pay | Admitting: Physician Assistant

## 2018-02-25 ENCOUNTER — Encounter: Payer: Self-pay | Admitting: Physician Assistant

## 2018-02-25 ENCOUNTER — Other Ambulatory Visit: Payer: Self-pay | Admitting: Physician Assistant

## 2018-02-25 DIAGNOSIS — F909 Attention-deficit hyperactivity disorder, unspecified type: Secondary | ICD-10-CM

## 2018-03-01 ENCOUNTER — Other Ambulatory Visit: Payer: Self-pay | Admitting: Physician Assistant

## 2018-03-01 DIAGNOSIS — F411 Generalized anxiety disorder: Secondary | ICD-10-CM

## 2018-03-01 MED ORDER — AMPHETAMINE-DEXTROAMPHETAMINE 10 MG PO TABS: 10.0000 mg | ORAL_TABLET | Freq: Every day | ORAL | 0 refills | Status: DC | PRN

## 2018-03-01 MED ORDER — AMPHETAMINE-DEXTROAMPHET ER 30 MG PO CP24: 30.0000 mg | ORAL_CAPSULE | Freq: Every day | ORAL | 0 refills | Status: DC

## 2018-03-01 MED ORDER — VENLAFAXINE HCL ER 37.5 MG PO CP24
37.5000 mg | ORAL_CAPSULE | Freq: Every day | ORAL | 1 refills | Status: DC
Start: 2018-03-01 — End: 2018-06-24

## 2018-03-17 ENCOUNTER — Encounter: Payer: Self-pay | Admitting: Physician Assistant

## 2018-04-12 ENCOUNTER — Encounter: Payer: Self-pay | Admitting: Physician Assistant

## 2018-04-12 ENCOUNTER — Ambulatory Visit: Payer: BLUE CROSS/BLUE SHIELD | Admitting: Physician Assistant

## 2018-04-13 ENCOUNTER — Ambulatory Visit (INDEPENDENT_AMBULATORY_CARE_PROVIDER_SITE_OTHER): Payer: BLUE CROSS/BLUE SHIELD | Admitting: Physician Assistant

## 2018-04-13 ENCOUNTER — Encounter: Payer: Self-pay | Admitting: Physician Assistant

## 2018-04-13 DIAGNOSIS — F909 Attention-deficit hyperactivity disorder, unspecified type: Secondary | ICD-10-CM

## 2018-04-13 MED ORDER — AMPHETAMINE-DEXTROAMPHETAMINE 10 MG PO TABS: 10.0000 mg | ORAL_TABLET | Freq: Every day | ORAL | 0 refills | Status: DC | PRN

## 2018-04-13 MED ORDER — AMPHETAMINE-DEXTROAMPHET ER 30 MG PO CP24: 30.0000 mg | ORAL_CAPSULE | Freq: Every day | ORAL | 0 refills | Status: DC

## 2018-04-13 NOTE — Progress Notes (Signed)
   Misty Ayers  MRN: [id number] DOB: [birth year]  PCP: Garnetta Buddy, PA  Subjective:  Pt is a 27 year old female who presents to clinic for medication counseling. She is a former pt of [employer].   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 This has significantly helped reduce her stress level.  Anxiety stuff to work through. She is not taking Effexor - she would like to try to improve anxiety with lifestyle changes before starting medication. She is eating better and working out. She is seeing therapist at that [facility] - "this is helping, for the most part".   Not having high stress work environment is helping. She is taking Adderall XR 30 mg and Adderall  - she used to take this in the afternoon around 2 pm - she is not doing this much anymore. She used to be taking this for school. Now she takes  for half days at work.  She is seeing [facility].   Review of Systems  Gastrointestinal: Negative for abdominal pain, nausea and vomiting.  Psychiatric/Behavioral: Positive for decreased concentration. Negative for dysphoric mood, self-injury and suicidal ideas. The patient is nervous/anxious.     There are no active problems to display for this patient.   Current Outpatient Medications on File Prior to Visit  Medication Sig Dispense Refill  . amphetamine-dextroamphetamine (ADDERALL XR) 30 MG 24 hr capsule Take 1 capsule (30 mg total) by mouth daily. 30 capsule 0  . amphetamine-dextroamphetamine (ADDERALL) 10 MG tablet Take 1 tablet (10 mg total) by mouth daily as needed. 10 tablet 0  . amphetamine-dextroamphetamine (ADDERALL XR) 30 MG 24 hr capsule Take 1 capsule (30 mg total) by mouth every morning. Fill after 30 days (Patient not taking: Reported on 01/06/2018) 30 capsule 0  . venlafaxine XR (EFFEXOR XR) 37.5 MG 24 hr capsule Take 1 capsule (37.5 mg total) by mouth daily with breakfast. (Patient not taking: Reported on  04/13/2018) 30 capsule 1   No current facility-administered medications on file prior to visit.     Allergies  Allergen Reactions  . Penicillins     unknown     Objective:  LMP 04/08/2018 Comment: Nexplanon  Physical Exam  Constitutional: She is oriented to person, place, and time. No distress.  Cardiovascular: Normal rate, regular rhythm and normal heart sounds.  Neurological: She is alert and oriented to person, place, and time.  Skin: Skin is warm and dry.  Psychiatric: Judgment normal.  Vitals reviewed.   Assessment and Plan :  1. Attention deficit hyperactivity disorder (ADHD), unspecified ADHD type - amphetamine-dextroamphetamine (ADDERALL) 10 MG tablet; Take 1 tablet (10 mg total) by mouth daily as needed.  Dispense: 10 tablet; Refill: 0 - amphetamine-dextroamphetamine (ADDERALL XR) 30 MG 24 hr capsule; Take 1 capsule (30 mg total) by mouth daily.  Dispense: 30 capsule; Refill: 0 - pt is doing well. She is working on lifestyle changes to reduce stress and anxiety - working out and eating better, also is seeing a therapist. She would like to discuss reducing dose of Adderall at her next office visit. Plan to fill one month of adderall  and . RTC in 1 month to discuss reducing adderall dose.   Marco Collie, PA-C  Primary Care at [facility] 04/13/2018 12:10 PM

## 2018-04-13 NOTE — Patient Instructions (Addendum)
  Come back and see me in 3 months for medication recheck.   Thank you for coming in today. I hope you feel we met your needs.  Feel free to call PCP if you have any questions or further requests.  Please consider signing up for MyChart if you do not already have it, as this is a great way to communicate with me.  Best,  [employer], PA-C

## 2018-06-24 ENCOUNTER — Ambulatory Visit: Payer: BLUE CROSS/BLUE SHIELD | Admitting: Physician Assistant

## 2018-06-24 ENCOUNTER — Encounter: Payer: Self-pay | Admitting: Physician Assistant

## 2018-06-24 ENCOUNTER — Other Ambulatory Visit: Payer: Self-pay

## 2018-06-24 VITALS — BP 120/80 | HR 100 | Temp 99.0°F | Ht 64.57 in | Wt 103.4 lb

## 2018-06-24 DIAGNOSIS — Z7689 Persons encountering health services in other specified circumstances: Secondary | ICD-10-CM

## 2018-06-24 DIAGNOSIS — R636 Underweight: Secondary | ICD-10-CM

## 2018-06-24 DIAGNOSIS — F909 Attention-deficit hyperactivity disorder, unspecified type: Secondary | ICD-10-CM

## 2018-06-24 DIAGNOSIS — F411 Generalized anxiety disorder: Secondary | ICD-10-CM

## 2018-06-24 MED ORDER — AMPHETAMINE-DEXTROAMPHETAMINE 10 MG PO TABS: 10.0000 mg | ORAL_TABLET | Freq: Every day | ORAL | 0 refills | Status: DC | PRN

## 2018-06-24 MED ORDER — AMPHETAMINE-DEXTROAMPHET ER 30 MG PO CP24: 30.0000 mg | ORAL_CAPSULE | ORAL | 0 refills | Status: DC

## 2018-06-24 MED ORDER — HYDROXYZINE HCL 25 MG PO TABS: 12.5000 mg | ORAL_TABLET | Freq: Three times a day (TID) | ORAL | 0 refills | Status: DC | PRN

## 2018-06-24 NOTE — Patient Instructions (Addendum)
I have refilled your ADHD medication. I recommend starting back counseling.. I also recommend a short term medication for moments of panic attacks and sleepy difficult. This medication is called hydroxyzine. It can make you drowsy so please keep this in mind. Follow up in 3 months or sooner if symptoms worsen. Thank you for letting me participate in your health and well being.  Living With Anxiety After being diagnosed with an anxiety disorder, you may be relieved to know why you have felt or behaved a certain way. It is natural to also feel overwhelmed about the treatment ahead and what it will mean for your life. With care and support, you can manage this condition and recover from it. How to cope with anxiety Dealing with stress Stress is your body's reaction to life changes and events, both good and bad. Stress can last just a few hours or it can be ongoing. Stress can play a major role in anxiety, so it is important to learn both how to cope with stress and how to think about it differently. Talk with your health care provider or a counselor to learn more about stress reduction. He or she may suggest some stress reduction techniques, such as:  Music therapy. This can include creating or listening to music that you enjoy and that inspires you.  Mindfulness-based meditation. This involves being aware of your normal breaths, rather than trying to control your breathing. It can be done while sitting or walking.  Centering prayer. This is a kind of meditation that involves focusing on a word, phrase, or sacred image that is meaningful to you and that brings you peace.  Deep breathing. To do this, expand your stomach and inhale slowly through your nose. Hold your breath for 3-5 seconds. Then exhale slowly, allowing your stomach muscles to relax.  Self-talk. This is a skill where you identify thought patterns that lead to anxiety reactions and correct those thoughts.  Muscle relaxation. This  involves tensing muscles then relaxing them.  Choose a stress reduction technique that fits your lifestyle and personality. Stress reduction techniques take time and practice. Set aside 5-15 minutes a day to do them. Therapists can offer training in these techniques. The training may be covered by some insurance plans. Other things you can do to manage stress include:  Keeping a stress diary. This can help you learn what triggers your stress and ways to control your response.  Thinking about how you respond to certain situations. You may not be able to control everything, but you can control your reaction.  Making time for activities that help you relax, and not feeling guilty about spending your time in this way.  Therapy combined with coping and stress-reduction skills provides the best chance for successful treatment. Medicines Medicines can help ease symptoms. Medicines for anxiety include:  Anti-anxiety drugs.  Antidepressants.  Beta-blockers.  Medicines may be used as the main treatment for anxiety disorder, along with therapy, or if other treatments are not working. Medicines should be prescribed by a health care provider. Relationships Relationships can play a big part in helping you recover. Try to spend more time connecting with trusted friends and family members. Consider going to couples counseling, taking family education classes, or going to family therapy. Therapy can help you and others better understand the condition. How to recognize changes in your condition Everyone has a different response to treatment for anxiety. Recovery from anxiety happens when symptoms decrease and stop interfering with your daily activities at  home or work. This may mean that you will start to:  Have better concentration and focus.  Sleep better.  Be less irritable.  Have more energy.  Have improved memory.  It is important to recognize when your condition is getting worse. Contact your  health care provider if your symptoms interfere with home or work and you do not feel like your condition is improving. Where to find help and support: You can get help and support from these sources:  Self-help groups.  Online and [employer].  A trusted spiritual leader.  Couples counseling.  Family education classes.  Family therapy.  Follow these instructions at home:  Eat a healthy diet that includes plenty of vegetables, fruits, whole grains, low-fat dairy products, and lean protein. Do not eat a lot of foods that are high in solid fats, added sugars, or salt.  Exercise. Most adults should do the following: ? Exercise for at least 150 minutes each week. The exercise should increase your heart rate and make you sweat (moderate-intensity exercise). ? Strengthening exercises at least twice a week.  Cut down on caffeine, tobacco, alcohol, and other potentially harmful substances.  Get the right amount and quality of sleep. Most adults need 7-9 hours of sleep each night.  Make choices that simplify your life.  Take over-the-counter and prescription medicines only as told by your health care provider.  Avoid caffeine, alcohol, and certain over-the-counter cold medicines. These may make you feel worse. Ask your pharmacist which medicines to avoid.  Keep all follow-up visits as told by your health care provider. This is important. Questions to ask your health care provider  Would I benefit from therapy?  How often should I follow up with a health care provider?  How long do I need to take medicine?  Are there any long-term side effects of my medicine?  Are there any alternatives to taking medicine? Contact a health care provider if:  You have a hard time staying focused or finishing daily tasks.  You spend many hours a day feeling worried about everyday life.  You become exhausted by worry.  You start to have headaches, feel tense, or have  nausea.  You urinate more than normal.  You have diarrhea. Get help right away if:  You have a racing heart and shortness of breath.  You have thoughts of hurting yourself or others. If you ever feel like you may hurt yourself or others, or have thoughts about taking your own life, get help right away. You can go to your nearest emergency department or call:  Your local emergency services (911 in the [place]  A suicide crisis helpline, such as the National Suicide Prevention Lifeline at 212-639-7642. This is open 24-hours a day.  Summary  Taking steps to deal with stress can help calm you.  Medicines cannot cure anxiety disorders, but they can help ease symptoms.  Family, friends, and partners can play a big part in helping you recover from an anxiety disorder. This information is not intended to replace advice given to you by your health care provider. Make sure you discuss any questions you have with your health care provider. Document Released: 11/18/2016 Document Revised: 11/18/2016 Document Reviewed: 11/18/2016 Elsevier Interactive Patient Education  2018 [employer].   IF you received an x-ray today, you will receive an invoice from [facility]. Please contact [facility] at [phone] with questions or concerns regarding your invoice.   IF you received labwork today, you will receive an invoice  from [facility]. Please contact [facility] at [phone] with questions or concerns regarding your invoice.   Our billing staff will not be able to assist you with questions regarding bills from these companies.  You will be contacted with the lab results as soon as they are available. The fastest way to get your results is to activate your My Chart account. Instructions are located on the last page of this paperwork. If you have not heard from [place] regarding the results in 2 weeks, please contact this office.

## 2018-06-24 NOTE — Progress Notes (Signed)
JAVIA DILLOW  MRN: [id number] DOB: 09-May-1991  Subjective:  Misty Ayers is a 27 y.o. female seen in office today for a chief complaint of medication refill for ADHD and to establish care.  Also has PMH of anxiety. Has been on med for ADHD for about 5 years. Has tried multiple meds for anxiety in the past including (SSRIs (rxn of suspected serotonin syndrome), SNRIs (cannot remember rxn but did not like it), buspar (didn't like the way it make her feel), wellbutrin (made her extremely sleepy when med wore off).  Has also tried multiple attention medications in the past including Concerta, Vyvanse, and Ritalin.  Notices that Adderall XR 30 mg daily along with Adderall IR 10 mg a few times a week is the best regimen for her.  Has also tried counseling in the past.  [facility] but " graduated" there program.  They recommended to follow-up with additional counselors but she is not sure if she would benefit from this.  She exercises frequently.    Her biggest concern today is that she does not want to be on medication for the rest of her life due to her "problems."  She is wondering if she can use medication as needed and works as a Engineer, civil (consulting) so has tried taking medication on a as needed basis.  Just dropped down to one job. A lot less stressful.  Notes she does not feel bad when she is off the medication but the people around her say things making her think she needs to be on medication. For example, when on it people say that she is "a lot calmer." When off of it,people seem to often make comments such as: "youre being too much, are you okay today, have you had too much coffee."  Notes she can handle this when her coworkers do it but when her  fianc asks her this it hurts her feelings and makes her think she needs to be on medication.  When she is on Adderall, she does feel way more controlled and feels like she can focus and get test done more easily.  In terms of  anxiety she will have frequent moments of increased panic attacks.  Adderall does help with this as well.  Does have decreased appetite on medication but sets alarms on her phone to make sure she is eating frequently. Will occasionally have difficulty sleeping throughout entire night.   Denies weight loss, chest pain, palpitations, shortness of breath, hallucinations, SI or HI.  Review of Systems  Per HPI  There are no active problems to display for this patient.   Current Outpatient Medications on File Prior to Visit  Medication Sig Dispense Refill  . etonogestrel (NEXPLANON) 68 MG IMPL implant 1 each by Subdermal route once.    . ISOtretinoin (AMNESTEEM) 40 MG capsule Take 40 mg by mouth 2 (two) times daily.    Marland Kitchen amphetamine-dextroamphetamine (ADDERALL XR) 30 MG 24 hr capsule Take 1 capsule (30 mg total) by mouth every morning. Fill after 30 days (Patient not taking: Reported on 01/06/2018) 30 capsule 0  . amphetamine-dextroamphetamine (ADDERALL) 10 MG tablet Take 1 tablet (10 mg total) by mouth daily as needed. 10 tablet 0   No current facility-administered medications on file prior to visit.     Allergies  Allergen Reactions  . Penicillins     unknown     Objective:  BP 120/80 (BP Location: Left Arm, Patient Position: Sitting, Cuff Size:  Normal)   Pulse 100   Temp 99 F (37.2 C) (Oral)   Ht 5' 4.57" (1.64 m)   Wt 103 lb 6.4 oz (46.9 kg)   SpO2 100%   BMI 17.44 kg/m   Physical Exam  Constitutional: She is oriented to person, place, and time. She appears well-developed and well-nourished. No distress.  HENT:  Head: Normocephalic and atraumatic.  Eyes: Conjunctivae are normal.  Neck: Normal range of motion.  Cardiovascular: Normal rate, regular rhythm and normal heart sounds.  Pulmonary/Chest: Effort normal.  Neurological: She is alert and oriented to person, place, and time.  Skin: Skin is warm and dry.  Psychiatric: She has a normal mood and affect.  Vitals  reviewed.    Wt Readings from Last 3 Encounters:  06/24/18 103 lb 6.4 oz (46.9 kg)  04/13/18 106 lb 9.6 oz (48.4 kg)  02/08/18 105 lb (47.6 kg)    Assessment and Plan :  1. Attention deficit hyperactivity disorder (ADHD), unspecified ADHD type Rec continuing with XR dose daily and only using IR as needed. Pt thinks she will only need #10 tablets of IR for one month. I also strongly encouraged she get back into counseling as this will help with overall sx. Continue exercising. May consider supplementing with OTC Mg to see if this helps.   [place] Controlled Substance Database Registry was reviewed and is consistent with patient's history. Follow up in 3 months.  - ToxASSURE Select 13 (MW), Urine - Care order/instruction - amphetamine-dextroamphetamine (ADDERALL XR) 30 MG 24 hr capsule; Take 1 capsule (30 mg total) by mouth every morning. Do not fill before 60 days.  Dispense: 30 capsule; Refill: 0 - amphetamine-dextroamphetamine (ADDERALL) 10 MG tablet; Take 1 tablet (10 mg total) by mouth daily as needed. Do not fill before 60 days.  Dispense: 10 tablet; Refill: 0  2. Anxiety state Rec adding hydroxyzine prn. Pt agrees to trying this. Given educational material on living with anxiety.  - hydrOXYzine (ATARAX/VISTARIL) 25 MG tablet; Take 0.5-1 tablets (12.5-25 mg total) by mouth every 8 (eight) hours as needed.  Dispense: 30 tablet; Refill: 0  3. Underweight BMI 17.44. Will keep an eye on this. Continue setting reminders to eat frequent meals throughout the day.   4. Encounter to establish care I am happy to take over pt's care. She will need to complete controlled substance agreement at f/u visit.   A total of 25 was spent in the room with the patient, greater than 50% of which was in counseling/coordination of care regarding ADHD and anxiety.Marland Kitchen.   Benjiman CoreBrittany Wiseman PA-C  [facility] at [facility]  [facility] 06/24/2018 9:45 AM

## 2018-06-25 ENCOUNTER — Encounter: Payer: Self-pay | Admitting: Physician Assistant

## 2018-06-30 ENCOUNTER — Other Ambulatory Visit: Payer: Self-pay

## 2018-06-30 ENCOUNTER — Encounter: Payer: Self-pay | Admitting: Physician Assistant

## 2018-06-30 ENCOUNTER — Ambulatory Visit: Payer: BLUE CROSS/BLUE SHIELD | Admitting: Physician Assistant

## 2018-06-30 VITALS — BP 120/77 | HR 103 | Temp 99.2°F | Resp 20 | Ht 64.45 in | Wt 105.6 lb

## 2018-06-30 DIAGNOSIS — R51 Headache: Secondary | ICD-10-CM | POA: Diagnosis not present

## 2018-06-30 DIAGNOSIS — M6289 Other specified disorders of muscle: Secondary | ICD-10-CM

## 2018-06-30 DIAGNOSIS — R519 Headache, unspecified: Secondary | ICD-10-CM

## 2018-06-30 LAB — TOXASSURE SELECT 13 (MW), URINE

## 2018-06-30 MED ORDER — CYCLOBENZAPRINE HCL 5 MG PO TABS: 5.0000 mg | ORAL_TABLET | Freq: Three times a day (TID) | ORAL | 0 refills | Status: DC | PRN

## 2018-06-30 MED ORDER — KETOROLAC TROMETHAMINE 30 MG/ML IJ SOLN
30.0000 mg | Freq: Once | INTRAMUSCULAR | Status: AC
  Administered 2018-06-30: 30 mg via INTRAMUSCULAR

## 2018-06-30 NOTE — Progress Notes (Signed)
Misty Ayers  MRN: [id number] DOB: [birth year]  Subjective:  Misty Ayers is a 27 y.o. female seen in office today for a chief complaint of headache.  Onset: 1 day ago  Location: forehead Quality: sharp, aching, dull 5/10 Precipitating factors: work Prior treatment: ibuprofen w/ some relief but after few hours, headache returned  Associated Symptoms Nausea/vomiting: no  Photophobia/phonophobia: yes, mild   Tearing of eyes: no  Sinus pain/pressure: yes, mild left side, felt almost like a numbing sensation in left sinus cavity yesterday, which resolved. Family hx migraine: no  Personal stressors: yes  Relation to menstrual cycle: yes   Red Flags Fever: no  Neck pain/stiffness: no  Vision/speech/swallow/hearing difficulty: no  Focal weakness/numbness: no  Altered mental status: no  Trauma: no  Thunderclap headache: no  Anticoagulant use: no  H/o cancer/HIV/Pregnancy: no    Did just start taking hydroxyzine a few days ago.  Was very dehydrated at work yesterday. Started drinking more water today. Feeling better.  Getting at least 6 hours of sleep at night.  Has PMH of tension type headaches, does have some tension in neck and shoulders.   Review of Systems  HENT: Negative for rhinorrhea and sneezing.   Eyes: Negative for visual disturbance.  Allergic/Immunologic: Negative for environmental allergies.    There are no active problems to display for this patient.   Current Outpatient Medications on File Prior to Visit  Medication Sig Dispense Refill  . amphetamine-dextroamphetamine (ADDERALL XR) 30 MG 24 hr capsule Take 1 capsule (30 mg total) by mouth every morning. Do not fill before 60 days. 30 capsule 0  . amphetamine-dextroamphetamine (ADDERALL) 10 MG tablet Take 1 tablet (10 mg total) by mouth daily as needed. Do not fill before 60 days. 10 tablet 0  . etonogestrel (NEXPLANON) 68 MG IMPL implant 1 each by Subdermal route once.    . hydrOXYzine  (ATARAX/VISTARIL) 25 MG tablet Take 0.5-1 tablets (12.5-25 mg total) by mouth every 8 (eight) hours as needed. 30 tablet 0  . ISOtretinoin (AMNESTEEM) 40 MG capsule Take 40 mg by mouth 2 (two) times daily.     No current facility-administered medications on file prior to visit.     Allergies  Allergen Reactions  . Penicillins     unknown     Objective:  BP 120/77 (BP Location: Left Arm, Patient Position: Sitting, Cuff Size: Normal)   Pulse (!) 103   Temp 99.2 F (37.3 C) (Oral)   Resp 20   Ht 5' 4.45" (1.637 m)   Wt 105 lb 9.6 oz (47.9 kg)   LMP 06/23/2018 (Approximate)   SpO2 100%   BMI 17.87 kg/m   Physical Exam  Constitutional: She is oriented to person, place, and time. She appears well-developed and well-nourished. She does not appear ill. No distress.  HENT:  Head: Normocephalic and atraumatic.  Nose: Nose normal. Right sinus exhibits no maxillary sinus tenderness and no frontal sinus tenderness. Left sinus exhibits no maxillary sinus tenderness and no frontal sinus tenderness.  Mouth/Throat: Uvula is midline, oropharynx is clear and moist and mucous membranes are normal. No tonsillar exudate.  No pain with palpation of bilateral temporal regions.   Eyes: Pupils are equal, round, and reactive to light. Conjunctivae and EOM are normal.  Fundoscopic exam:      The right eye shows no AV nicking, no exudate, no hemorrhage and no papilledema. The right eye shows red reflex.       The left eye shows no AV  nicking, no exudate, no hemorrhage and no papilledema. The left eye shows red reflex.  Neck: Normal range of motion and full passive range of motion without pain. Muscular tenderness (with palpation of b/l trapezius musculature) present. No spinous process tenderness present. No neck rigidity. No Brudzinski's sign and no Kernig's sign noted.  Cardiovascular: Normal rate, regular rhythm and normal heart sounds.  Pulmonary/Chest: Effort normal and breath sounds normal.    Neurological: She is alert and oriented to person, place, and time. She has normal strength and normal reflexes. No cranial nerve deficit or sensory deficit. She displays a negative Romberg sign. Gait normal.  Normal FNF and HTS test.  Normal Tandem walk.   Skin: Skin is warm and dry.  Psychiatric: She has a normal mood and affect.  Vitals reviewed.   Assessment and Plan :  1. Acute nonintractable headache, unspecified headache type Pt is overall well appearing, NAD. PE reassuring. No acute findings. Normal neuro exam. Suspect dehydration could have led to headache. Pt does have some neck tension, would likely benefit from heating pad, stretching, increasing oral hydration, and muscle relaxants as needed. Also concerned that pt is experiencing side effect of newly Rx hydroxyzine. Rec d/c over the next few days to see if this makes a difference. Advised to return to clinic if symptoms worsen, do not improve, or as needed. - ketorolac (TORADOL) 30 MG/ML injection 30 mg  2. Muscle tightness - cyclobenzaprine (FLEXERIL) 5 MG tablet; Take 1 tablet (5 mg total) by mouth 3 (three) times daily as needed for muscle spasms.  Dispense: 60 tablet; Refill: 0    Benjiman CoreBrittany Elwyn Lowden PA-C  [facility]  [facility] 06/30/2018 12:33 PM

## 2018-06-30 NOTE — Patient Instructions (Addendum)
I recommend you go home and rest the remainder of today. We have given you a strong antiinflammatory shot today.  You may take ibuprofen again before bed time, no earlier than 8pm. Make sure you are staying hydrated. You can use flexeril for muscle tension. If you start to develop an sinus infection, please let me know. If any symptoms worsen or you develop new concerning sx, please seek care immediately.  General Headache Without Cause A headache is pain or discomfort felt around the head or neck area. There are many causes and types of headaches. In some cases, the cause may not be found. Follow these instructions at home: Managing pain  Take over-the-counter and prescription medicines only as told by your doctor.  Lie down in a dark, quiet room when you have a headache.  If directed, apply ice to the head and neck area: ? Put ice in a plastic bag. ? Place a towel between your skin and the bag. ? Leave the ice on for 20 minutes, 2-3 times per day.  Use a heating pad or hot shower to apply heat to the head and neck area as told by your doctor.  Keep lights dim if bright lights bother you or make your headaches worse. Eating and drinking  Eat meals on a regular schedule.  Lessen how much alcohol you drink.  Lessen how much caffeine you drink, or stop drinking caffeine. General instructions  Keep all follow-up visits as told by your doctor. This is important.  Keep a journal to find out if certain things bring on headaches. For example, write down: ? What you eat and drink. ? How much sleep you get. ? Any change to your diet or medicines.  Relax by getting a massage or doing other relaxing activities.  Lessen stress.  Sit up straight. Do not tighten (tense) your muscles.  Do not use tobacco products. This includes cigarettes, chewing tobacco, or e-cigarettes. If you need help quitting, ask your doctor.  Exercise regularly as told by your doctor.  Get enough sleep. This  often means 7-9 hours of sleep. Contact a doctor if:  Your symptoms are not helped by medicine.  You have a headache that feels different than the other headaches.  You feel sick to your stomach (nauseous) or you throw up (vomit).  You have a fever. Get help right away if:  Your headache becomes really bad.  You keep throwing up.  You have a stiff neck.  You have trouble seeing.  You have trouble speaking.  You have pain in the eye or ear.  Your muscles are weak or you lose muscle control.  You lose your balance or have trouble walking.  You feel like you will pass out (faint) or you pass out.  You have confusion. This information is not intended to replace advice given to you by your health care provider. Make sure you discuss any questions you have with your health care provider. Document Released: 09/02/2008 Document Revised: 05/01/2016 Document Reviewed: 03/19/2015 Elsevier Interactive Patient Education  2018 [employer]    IF you received an x-ray today, you will receive an invoice from [facility]. Please contact [facility] at [phone] with questions or concerns regarding your invoice.   IF you received labwork today, you will receive an invoice from [facility]. Please contact [facility] at [phone] with questions or concerns regarding your invoice.   Our billing staff will not be able to assist you with questions regarding bills from these companies.  You will be contacted with the lab results as soon as they are available. The fastest way to get your results is to activate your My Chart account. Instructions are located on the last page of this paperwork. If you have not heard from [place] regarding the results in 2 weeks, please contact this office.

## 2018-07-06 ENCOUNTER — Encounter: Payer: Self-pay | Admitting: Physician Assistant

## 2018-07-30 ENCOUNTER — Ambulatory Visit: Payer: BLUE CROSS/BLUE SHIELD | Admitting: Emergency Medicine

## 2018-09-23 ENCOUNTER — Other Ambulatory Visit: Payer: Self-pay | Admitting: Physician Assistant

## 2018-09-23 DIAGNOSIS — F411 Generalized anxiety disorder: Secondary | ICD-10-CM

## 2018-09-23 DIAGNOSIS — M6289 Other specified disorders of muscle: Secondary | ICD-10-CM

## 2018-09-23 DIAGNOSIS — F909 Attention-deficit hyperactivity disorder, unspecified type: Secondary | ICD-10-CM

## 2018-09-23 NOTE — Telephone Encounter (Signed)
Copied from CRM [id number]. Topic: Quick Communication - Rx Refill/Question >> Sep 23, 2018 12:36 PM Jaquita Rector A wrote: Medication: amphetamine-dextroamphetamine (ADDERALL XR) 30 MG 24 hr,  amphetamine-dextroamphetamine (ADDERALL) 10 MG tablet, cyclobenzaprine (FLEXERIL) 5 MG tablet   Has the patient contacted their pharmacy? Yes.     Preferred Pharmacy (with phone number or street name): [id number] - [place], [place] - [address] AT [facility] [address] [facility] [address] [phone] (Phone) [phone] (Fax)    Agent: Please be advised that RX refills may take up to 3 business days. We ask that you follow-up with your pharmacy.

## 2018-09-23 NOTE — Telephone Encounter (Signed)
Requested medication (s) are due for refill today: yes  Requested medication (s) are on the active medication list: yes  Last refill:  06/2018  Future visit scheduled: yes 10/08/18  Notes to clinic:      Requested Prescriptions  Pending Prescriptions Disp Refills   cyclobenzaprine (FLEXERIL) 5 MG tablet 60 tablet 0    Sig: Take 1 tablet (5 mg total) by mouth 3 (three) times daily as needed for muscle spasms.     Not Delegated - Analgesics:  Muscle Relaxants Failed - 09/23/2018 12:53 PM      Failed - This refill cannot be delegated      Passed - Valid encounter within last 6 months    Recent Outpatient Visits          2 months ago Acute nonintractable headache, unspecified headache type   Primary Care at [facility], [place] D, PA-C   3 months ago Attention deficit hyperactivity disorder (ADHD), unspecified ADHD type   Primary Care at [place], [place] D, PA-C   5 months ago Attention deficit hyperactivity disorder (ADHD), unspecified ADHD type   Primary Care at [facility], Madelaine Bhat, PA-C   7 months ago Anxiety state   Primary Care at [place], [place] D, [place]   8 months ago Iron deficiency anemia, unspecified iron deficiency anemia type   Primary Care at [place], [place] D, [place]      Future Appointments            In 2 weeks Barnett Abu, [place] D, PA-C Primary Care at [place], [facility]          amphetamine-dextroamphetamine (ADDERALL XR) 30 MG 24 hr capsule 30 capsule 0    Sig: Take 1 capsule (30 mg total) by mouth every morning. Do not fill before 60 days.     Not Delegated - Psychiatry:  Stimulants/ADHD Failed - 09/23/2018 12:53 PM      Failed - This refill cannot be delegated      Failed - Urine Drug Screen completed in last 360 days.      Failed - Valid encounter within last 3 months    Recent Outpatient Visits          2 months ago Acute nonintractable headache, unspecified headache type   Primary Care at [facility], [place]  D, PA-C   3 months ago Attention deficit hyperactivity disorder (ADHD), unspecified ADHD type   Primary Care at [facility], [place] D, PA-C   5 months ago Attention deficit hyperactivity disorder (ADHD), unspecified ADHD type   Primary Care at [facility], Madelaine Bhat, PA-C   7 months ago Anxiety state   Primary Care at [place], [place] D, [place]   8 months ago Iron deficiency anemia, unspecified iron deficiency anemia type   Primary Care at [place], [place] D, [place]      Future Appointments            In 2 weeks Magdalene River, PA-C Primary Care at [place], [facility]

## 2018-09-24 ENCOUNTER — Other Ambulatory Visit: Payer: Self-pay | Admitting: Physician Assistant

## 2018-09-24 ENCOUNTER — Encounter: Payer: Self-pay | Admitting: Physician Assistant

## 2018-09-24 DIAGNOSIS — F411 Generalized anxiety disorder: Secondary | ICD-10-CM

## 2018-09-24 DIAGNOSIS — F909 Attention-deficit hyperactivity disorder, unspecified type: Secondary | ICD-10-CM

## 2018-09-24 DIAGNOSIS — M6289 Other specified disorders of muscle: Secondary | ICD-10-CM

## 2018-09-24 MED ORDER — AMPHETAMINE-DEXTROAMPHET ER 30 MG PO CP24: 30.0000 mg | ORAL_CAPSULE | ORAL | 0 refills | Status: DC

## 2018-09-24 MED ORDER — CYCLOBENZAPRINE HCL 5 MG PO TABS: 5.0000 mg | ORAL_TABLET | Freq: Three times a day (TID) | ORAL | 0 refills | Status: DC | PRN

## 2018-09-24 MED ORDER — HYDROXYZINE HCL 25 MG PO TABS
12.5000 mg | ORAL_TABLET | Freq: Three times a day (TID) | ORAL | 0 refills | Status: DC | PRN
Start: 2018-09-24 — End: 2018-10-08

## 2018-09-24 MED ORDER — AMPHETAMINE-DEXTROAMPHETAMINE 10 MG PO TABS: 10.0000 mg | ORAL_TABLET | Freq: Every day | ORAL | 0 refills | Status: DC | PRN

## 2018-09-24 NOTE — Progress Notes (Signed)
Meds ordered this encounter  Medications  . amphetamine-dextroamphetamine (ADDERALL XR) 30 MG 24 hr capsule    Sig: Take 1 capsule (30 mg total) by mouth every morning. Do not fill before 60 days.    Dispense:  30 capsule    Refill:  0    Order Specific Question:   Supervising Provider    Answer:   Georgina Quint [id number]  . amphetamine-dextroamphetamine (ADDERALL) 10 MG tablet    Sig: Take 1 tablet (10 mg total) by mouth daily as needed. Do not fill before 60 days.    Dispense:  10 tablet    Refill:  0    Order Specific Question:   Supervising Provider    Answer:   Georgina Quint [id number]  . hydrOXYzine (ATARAX/VISTARIL) 25 MG tablet    Sig: Take 0.5-1 tablets (12.5-25 mg total) by mouth every 8 (eight) hours as needed.    Dispense:  30 tablet    Refill:  0    Order Specific Question:   Supervising Provider    Answer:   Georgina Quint [id number]  . cyclobenzaprine (FLEXERIL) 5 MG tablet    Sig: Take 1 tablet (5 mg total) by mouth 3 (three) times daily as needed for muscle spasms.    Dispense:  60 tablet    Refill:  0    Order Specific Question:   Supervising Provider    Answer:   Georgina Quint [phone]

## 2018-09-26 NOTE — Telephone Encounter (Signed)
Pt message sent to [place] Re: refill ADHD

## 2018-09-28 DIAGNOSIS — L709 Acne, unspecified: Secondary | ICD-10-CM | POA: Diagnosis not present

## 2018-09-28 DIAGNOSIS — K130 Diseases of lips: Secondary | ICD-10-CM | POA: Diagnosis not present

## 2018-10-07 ENCOUNTER — Encounter: Payer: BLUE CROSS/BLUE SHIELD | Admitting: Physician Assistant

## 2018-10-08 ENCOUNTER — Encounter: Payer: Self-pay | Admitting: Physician Assistant

## 2018-10-08 ENCOUNTER — Ambulatory Visit (INDEPENDENT_AMBULATORY_CARE_PROVIDER_SITE_OTHER): Payer: 59 | Admitting: Physician Assistant

## 2018-10-08 VITALS — BP 119/82 | HR 93 | Temp 98.7°F | Resp 16 | Ht 63.5 in | Wt 133.6 lb

## 2018-10-08 DIAGNOSIS — F411 Generalized anxiety disorder: Secondary | ICD-10-CM | POA: Diagnosis not present

## 2018-10-08 DIAGNOSIS — F909 Attention-deficit hyperactivity disorder, unspecified type: Secondary | ICD-10-CM

## 2018-10-08 MED ORDER — HYDROXYZINE HCL 25 MG PO TABS: 12.5000 mg | ORAL_TABLET | Freq: Three times a day (TID) | ORAL | 1 refills | Status: DC | PRN

## 2018-10-08 MED ORDER — AMPHETAMINE-DEXTROAMPHET ER 5 MG PO CP24: 5.0000 mg | ORAL_CAPSULE | Freq: Every day | ORAL | 0 refills | Status: DC

## 2018-10-08 NOTE — Patient Instructions (Addendum)
Try increasing adderrall XR to 35 mg in the morning. Take around 7am. Can increase to 40mg  in the am if you feel like you need to. Do not go beyond that dose before talking with me. Message me in a week or so to let me know what dose you need refills for. Follow up in 6 months. Thank you for letting me participate in your health and well being.    If you have lab work done today you will be contacted with your lab results within the next 2 weeks.  If you have not heard from [place] then please contact us. The fastest way to get your results is to register for My Chart.   IF you received an x-ray today, you will receive an invoice from [facility]. Please contact [facility] at [phone] with questions or concerns regarding your invoice.   IF you received labwork today, you will receive an invoice from [place]. Please contact [facility] at [phone] with questions or concerns regarding your invoice.   Our billing staff will not be able to assist you with questions regarding bills from these companies.  You will be contacted with the lab results as soon as they are available. The fastest way to get your results is to activate your My Chart account. Instructions are located on the last page of this paperwork. If you have not heard from [place] regarding the results in 2 weeks, please contact this office.

## 2018-10-08 NOTE — Progress Notes (Signed)
Misty Ayers  MRN: [id number] DOB: 27-Dec-1990  Subjective:  Misty Ayers is a 27 y.o. female seen in office today for a chief complaint of f/u on ADHD and anxiety.   Just got engaged. Is planning to have the wedding at the of December 2019. 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 For the past month, has felt that by 1-3pm the 30XR runs out. Does not like the idea of 10mg  IR in the afternoon because then she has diffculty sleeping.  Has also tried multiple attention medications in the past including Concerta, Vyvanse, and Ritalin, which she did not tolerate. Denies weight loss, chest pain, palpitations, shortness of breath, headache, and jitteriness. Denies illicit drug use.   Has finally been trying hydroxyzine and notes it is really helping. Feels grounded. Does not feel tired, sad, or anything else. Does not want to consider a daily medication yet. Denies SI and HI.   Review of Systems  Per HPI  There are no active problems to display for this patient.   Current Outpatient Medications on File Prior to Visit  Medication Sig Dispense Refill  . amphetamine-dextroamphetamine (ADDERALL XR) 30 MG 24 hr capsule Take 1 capsule (30 mg total) by mouth every morning. Do not fill before 60 days. 30 capsule 0  . amphetamine-dextroamphetamine (ADDERALL) 10 MG tablet Take 1 tablet (10 mg total) by mouth daily as needed. Do not fill before 60 days. 10 tablet 0  . etonogestrel (NEXPLANON) 68 MG IMPL implant 1 each by Subdermal route once.    . hydrOXYzine (ATARAX/VISTARIL) 25 MG tablet Take 0.5-1 tablets (12.5-25 mg total) by mouth every 8 (eight) hours as needed. 30 tablet 0  . ISOtretinoin (AMNESTEEM) 40 MG capsule Take 40 mg by mouth 2 (two) times daily.    . cyclobenzaprine (FLEXERIL) 5 MG tablet Take 1 tablet (5 mg total) by mouth 3 (three) times daily as needed for muscle spasms. (Patient not taking: Reported on 10/08/2018) 60 tablet 0   No current  facility-administered medications on file prior to visit.     Allergies  Allergen Reactions  . Penicillins     unknown     Objective:  BP 119/82 (BP Location: Left Arm, Patient Position: Sitting, Cuff Size: Normal)   Pulse 93   Temp 98.7 F (37.1 C) (Oral)   Resp 16   Ht 5' 3.5" (1.613 m)   Wt 133 lb 9.6 oz (60.6 kg)   SpO2 100%   BMI 23.29 kg/m   Physical Exam  Constitutional: She is oriented to person, place, and time. She appears well-developed and well-nourished. No distress.  HENT:  Head: Normocephalic and atraumatic.  Eyes: Conjunctivae are normal.  Neck: Normal range of motion.  Cardiovascular: Normal rate, regular rhythm, normal heart sounds and intact distal pulses.  Pulmonary/Chest: Effort normal.  Neurological: She is alert and oriented to person, place, and time.  Skin: Skin is warm and dry.  Psychiatric: She has a normal mood and affect.  Vitals reviewed.    GAD 7 : Generalized Anxiety Score 10/08/2018  Nervous, Anxious, on Edge 1  Control/stop worrying 2  Worry too much - different things 1  Trouble relaxing 1  Restless 1  Easily annoyed or irritable 1  Afraid - awful might happen 1  Total GAD 7 Score 8  Anxiety Difficulty Somewhat difficult     Wt Readings from Last 3 Encounters:  10/08/18 133 lb 9.6 oz (60.6 kg)  06/30/18 105 lb  9.6 oz (47.9 kg)  06/24/18 103 lb 6.4 oz (46.9 kg)    Assessment and Plan :  1. Attention deficit hyperactivity disorder (ADHD), unspecified ADHD type Feel as if she could have additional coverage in the afternoon but does not want to take IR tablet due to difficulty sleeping if taking this in the afternoon. Rec increase adderall XR to 35mg  daily, may increase to 40mg  in am if needed. Do not exceed this dose. Encouraged to mychart me in one week to let me know what dose she is taking so I can provide 3 additional months of refill for pt.  [place] Controlled Substance Database Registry was reviewed and is consistent  with patient's history. F/u in office in 6 months.  - ToxASSURE Select 13 (MW), Urine - amphetamine-dextroamphetamine (ADDERALL XR) 5 MG 24 hr capsule; Take 1-2 capsules (5-10 mg total) by mouth daily.  Dispense: 60 capsule; Refill: 0  2. Anxiety state Cont hydroxyzine as needed. F/u as needed.  - Care order/instruction - hydrOXYzine (ATARAX/VISTARIL) 25 MG tablet; Take 0.5-1 tablets (12.5-25 mg total) by mouth every 8 (eight) hours as needed.  Dispense: 60 tablet; Refill: 1  A total of 20 was spent in the room with the patient, greater than 50% of which was in counseling/coordination of care regarding above.   Benjiman Core PA-C  Primary Care at [facility] 10/08/2018 10:20 AM

## 2018-10-10 ENCOUNTER — Encounter: Payer: Self-pay | Admitting: Physician Assistant

## 2018-10-12 LAB — TOXASSURE SELECT 13 (MW), URINE

## 2018-10-16 ENCOUNTER — Encounter: Payer: Self-pay | Admitting: Physician Assistant

## 2018-10-18 ENCOUNTER — Other Ambulatory Visit: Payer: Self-pay | Admitting: Physician Assistant

## 2018-10-18 MED ORDER — AMPHETAMINE-DEXTROAMPHET ER 20 MG PO CP24: 40.0000 mg | ORAL_CAPSULE | ORAL | 0 refills | Status: DC

## 2018-10-18 MED ORDER — AMPHETAMINE-DEXTROAMPHET ER 20 MG PO CP24: 40.0000 mg | ORAL_CAPSULE | Freq: Every day | ORAL | 0 refills | Status: DC

## 2018-10-18 NOTE — Progress Notes (Signed)
Meds ordered this encounter  Medications  . DISCONTD: amphetamine-dextroamphetamine (ADDERALL XR) 20 MG 24 hr capsule    Sig: Take 2 capsules (40 mg total) by mouth daily.    Dispense:  60 capsule    Refill:  0    Do not fill before 10/24/18.    Order Specific Question:   Supervising Provider    Answer:   Georgina Quint [[id number]]  . amphetamine-dextroamphetamine (ADDERALL XR) 20 MG 24 hr capsule    Sig: Take 2 capsules (40 mg total) by mouth daily.    Dispense:  60 capsule    Refill:  0    Do not fill before 12/24/17.    Order Specific Question:   Supervising Provider    Answer:   Georgina Quint [[id number]]  . amphetamine-dextroamphetamine (ADDERALL XR) 20 MG 24 hr capsule    Sig: Take 2 capsules (40 mg total) by mouth every morning.    Dispense:  60 capsule    Refill:  0    Do not fill before 11/23/18.    Order Specific Question:   Supervising Provider    Answer:   Georgina Quint [[id number]]

## 2018-10-29 DIAGNOSIS — L709 Acne, unspecified: Secondary | ICD-10-CM | POA: Diagnosis not present

## 2018-10-29 DIAGNOSIS — L810 Postinflammatory hyperpigmentation: Secondary | ICD-10-CM | POA: Diagnosis not present

## 2018-10-29 DIAGNOSIS — K130 Diseases of lips: Secondary | ICD-10-CM | POA: Diagnosis not present

## 2018-11-22 ENCOUNTER — Other Ambulatory Visit: Payer: Self-pay | Admitting: Physician Assistant

## 2018-11-22 DIAGNOSIS — M6289 Other specified disorders of muscle: Secondary | ICD-10-CM

## 2018-11-23 ENCOUNTER — Other Ambulatory Visit: Payer: Self-pay | Admitting: Physician Assistant

## 2018-11-23 DIAGNOSIS — M6289 Other specified disorders of muscle: Secondary | ICD-10-CM

## 2018-11-23 NOTE — Telephone Encounter (Signed)
Requested medication (s) are due for refill today: yes  Requested medication (s) are on the active medication list: yes    Last refill: 09/24/18  #60  0 refills  Future visit scheduled no  Notes to clinic:not delegated; LRF by B. Barnett AbuWiseman  Requested Prescriptions  Pending Prescriptions Disp Refills   cyclobenzaprine (FLEXERIL) 5 MG tablet [Pharmacy Med Name: CYCLOBENZAPRINE 5MG  TABLETS] 60 tablet 0    Sig: TAKE 1 TABLET BY MOUTH THREE TIMES DAILY AS NEEDED FOR MUSCLES SPASMS     Not Delegated - Analgesics:  Muscle Relaxants Failed - 11/23/2018  3:36 AM      Failed - This refill cannot be delegated      Passed - Valid encounter within last 6 months    Recent Outpatient Visits          1 month ago Attention deficit hyperactivity disorder (ADHD), unspecified ADHD type   [facility] [place], [place] D, PA-C   4 months ago Acute nonintractable headache, unspecified headache type   [facility], [place] D, PA-C   5 months ago Attention deficit hyperactivity disorder (ADHD), unspecified ADHD type   [facility], [place] D, PA-C   7 months ago Attention deficit hyperactivity disorder (ADHD), unspecified ADHD type   [facility] at [facility] [place], Madelaine BhatElizabeth Whitney, PA-C   9 months ago Anxiety state   [facility] [place], [place], [place]

## 2018-11-24 NOTE — Telephone Encounter (Signed)
Patient is requesting a refill of the following medications: Requested Prescriptions   Pending Prescriptions Disp Refills  . cyclobenzaprine (FLEXERIL) 5 MG tablet 60 tablet 0    Sig: Take 1 tablet (5 mg total) by mouth 3 (three) times daily as needed for muscle spasms.  Marland Kitchen. amphetamine-dextroamphetamine (ADDERALL XR) 20 MG 24 hr capsule 60 capsule 0    Sig: Take 2 capsules (40 mg total) by mouth daily.  Marland Kitchen. amphetamine-dextroamphetamine (ADDERALL XR) 20 MG 24 hr capsule 60 capsule 0    Sig: Take 2 capsules (40 mg total) by mouth every morning.    Date of patient request: 11/24/18 Last office visit: 10/08/18 Date of last refill: 10/18/18 for the adderall and 09/24/18 for cyclobenzaprine Last refill amount: #60, #60 Follow up time period per chart: n/a  Please advise. Dgaddy, CMA

## 2018-11-25 ENCOUNTER — Encounter: Payer: Self-pay | Admitting: Physician Assistant

## 2018-11-25 ENCOUNTER — Other Ambulatory Visit: Payer: Self-pay | Admitting: Physician Assistant

## 2018-11-25 DIAGNOSIS — M6289 Other specified disorders of muscle: Secondary | ICD-10-CM

## 2018-11-25 NOTE — Telephone Encounter (Signed)
Please advise 

## 2018-11-25 NOTE — Telephone Encounter (Signed)
Patient is requesting a refill of the following medications: Requested Prescriptions   Pending Prescriptions Disp Refills  . cyclobenzaprine (FLEXERIL) 5 MG tablet 60 tablet 0    Sig: Take 1 tablet (5 mg total) by mouth 3 (three) times daily as needed for muscle spasms.  Marland Kitchen. amphetamine-dextroamphetamine (ADDERALL XR) 20 MG 24 hr capsule 60 capsule 0    Sig: Take 2 capsules (40 mg total) by mouth daily.  Marland Kitchen. amphetamine-dextroamphetamine (ADDERALL XR) 20 MG 24 hr capsule 60 capsule 0    Sig: Take 2 capsules (40 mg total) by mouth every morning.

## 2018-11-26 MED ORDER — CYCLOBENZAPRINE HCL 5 MG PO TABS: 5.0000 mg | ORAL_TABLET | Freq: Three times a day (TID) | ORAL | 0 refills | Status: DC | PRN

## 2018-11-26 NOTE — Telephone Encounter (Signed)
Refilled flexeril She has one more refill left for January for her adderral She needs to establish with new PCP before her feb refill is needed thanks

## 2018-11-27 NOTE — Telephone Encounter (Signed)
Pt contacted by Pietro Cassisavina and per pharmacy pt had 2 refills and she can come pick up refill.  Pt agreeable. Dgaddy, CMA

## 2018-11-27 NOTE — Telephone Encounter (Signed)
All medications have to be approved or denied before sending to scheduling pool because we cannot close the encounter with outstanding orders in it.  Thank you

## 2018-12-02 DIAGNOSIS — K130 Diseases of lips: Secondary | ICD-10-CM | POA: Diagnosis not present

## 2018-12-02 DIAGNOSIS — L810 Postinflammatory hyperpigmentation: Secondary | ICD-10-CM | POA: Diagnosis not present

## 2018-12-02 DIAGNOSIS — L709 Acne, unspecified: Secondary | ICD-10-CM | POA: Diagnosis not present

## 2019-01-03 DIAGNOSIS — L709 Acne, unspecified: Secondary | ICD-10-CM | POA: Diagnosis not present

## 2019-01-03 DIAGNOSIS — L281 Prurigo nodularis: Secondary | ICD-10-CM | POA: Diagnosis not present

## 2019-01-03 DIAGNOSIS — L130 Dermatitis herpetiformis: Secondary | ICD-10-CM | POA: Diagnosis not present

## 2019-01-03 DIAGNOSIS — Z5181 Encounter for therapeutic drug level monitoring: Secondary | ICD-10-CM | POA: Diagnosis not present

## 2019-01-10 ENCOUNTER — Other Ambulatory Visit: Payer: Self-pay | Admitting: Family Medicine

## 2019-01-10 ENCOUNTER — Ambulatory Visit: Payer: BLUE CROSS/BLUE SHIELD | Admitting: Family Medicine

## 2019-01-10 DIAGNOSIS — M6289 Other specified disorders of muscle: Secondary | ICD-10-CM

## 2019-01-12 ENCOUNTER — Encounter: Payer: Self-pay | Admitting: Family Medicine

## 2019-01-12 ENCOUNTER — Ambulatory Visit (INDEPENDENT_AMBULATORY_CARE_PROVIDER_SITE_OTHER): Payer: 59 | Admitting: Family Medicine

## 2019-01-12 VITALS — BP 108/64 | HR 88 | Temp 97.9°F | Ht 63.5 in | Wt 114.0 lb

## 2019-01-12 DIAGNOSIS — F411 Generalized anxiety disorder: Secondary | ICD-10-CM | POA: Insufficient documentation

## 2019-01-12 DIAGNOSIS — Z111 Encounter for screening for respiratory tuberculosis: Secondary | ICD-10-CM | POA: Diagnosis not present

## 2019-01-12 DIAGNOSIS — Z79899 Other long term (current) drug therapy: Secondary | ICD-10-CM | POA: Diagnosis not present

## 2019-01-12 DIAGNOSIS — F909 Attention-deficit hyperactivity disorder, unspecified type: Secondary | ICD-10-CM | POA: Insufficient documentation

## 2019-01-12 DIAGNOSIS — L659 Nonscarring hair loss, unspecified: Secondary | ICD-10-CM | POA: Diagnosis not present

## 2019-01-12 LAB — TSH: TSH: 0.46 u[IU]/mL (ref 0.35–4.50)

## 2019-01-12 LAB — T4, FREE: Free T4: 0.95 ng/dL (ref 0.60–1.60)

## 2019-01-12 MED ORDER — HYDROXYZINE HCL 25 MG PO TABS: 12.5000 mg | ORAL_TABLET | Freq: Three times a day (TID) | ORAL | 1 refills | Status: DC | PRN

## 2019-01-12 MED ORDER — AMPHETAMINE-DEXTROAMPHETAMINE 10 MG PO TABS: 10.0000 mg | ORAL_TABLET | Freq: Every day | ORAL | 0 refills | Status: DC | PRN

## 2019-01-12 MED ORDER — AMPHETAMINE-DEXTROAMPHET ER 30 MG PO CP24: 30.0000 mg | ORAL_CAPSULE | Freq: Every day | ORAL | 0 refills | Status: DC

## 2019-01-12 NOTE — Patient Instructions (Signed)
-  Great to meet you! -Refills have been sent over -I will see you back in 3 months.

## 2019-01-12 NOTE — Assessment & Plan Note (Signed)
Quantiferon gold ordered as she needs this for her job.

## 2019-01-12 NOTE — Assessment & Plan Note (Signed)
Borderline thyroid testing previously, update labs today.

## 2019-01-12 NOTE — Assessment & Plan Note (Signed)
Well managed with vistaril, continue.  Medication refilled.

## 2019-01-12 NOTE — Progress Notes (Signed)
Misty Ayers - 28 y.o. female MRN [id number]  Date of birth: 1991/05/18  Subjective Chief Complaint  Patient presents with  . Annual Exam    wants to discuss thyroid levels-has been noticing fatigue and hair loss    HPI Misty Ayers is a 28 y.o. female here today for initial visit.  She has a history of ADHD and anxiety.  She would also like to discuss thyroid testing.    -ADHD: History of ADHD, has been on adderall for several years.  Most recent dose of adderall xr 40mg , prior to this she was taking adderall XR 30mg  qam and adderall IR 10mg  in afternoon as needed.  She feels that 40mg  dose has caused a little more insomnia compared previous dosing and that prior dosing worked better for her.  She denies other side effects from medication.    -Anxiety:  This has been pretty well managed with vistaril prn in the evenings.  Sometimes feels a little groggy after taking but denies any other side effects.  Recently got married so wedding stress has passed which has been helpful.    -Thyroid testing:  She has had borderline TSH levels in the past.  Reports some increased fatigue and hair loss.  She would like to have this rechecked again   ROS:  A comprehensive ROS was completed and negative except as noted per HPI  Allergies  Allergen Reactions  . Penicillins     unknown    Past Medical History:  Diagnosis Date  . Anemia   . Anxiety   . Depression     No past surgical history on file.  Social History   Socioeconomic History  . Marital status: Single    Spouse name: Not on file  . Number of children: Not on file  . Years of education: college  . Highest education level: Not on file  Occupational History  . Not on file  Social Needs  . Financial resource strain: Not on file  . Food insecurity:    Worry: Not on file    Inability: Not on file  . Transportation needs:    Medical: Not on file    Non-medical: Not on file  Tobacco Use  . Smoking status: Never  Smoker  . Smokeless tobacco: Never Used  Substance and Sexual Activity  . Alcohol use: Yes    Comment: 3-5  . Drug use: No  . Sexual activity: Never    Birth control/protection: Abstinence  Lifestyle  . Physical activity:    Days per week: Not on file    Minutes per session: Not on file  . Stress: Not on file  Relationships  . Social connections:    Talks on phone: Not on file    Gets together: Not on file    Attends religious service: Not on file    Active member of club or organization: Not on file    Attends meetings of clubs or organizations: Not on file    Relationship status: Not on file  Other Topics Concern  . Not on file  Social History Narrative   Exercises    Family History  Problem Relation Age of Onset  . Stroke Mother   . Hyperlipidemia Mother   . Hypertension Mother   . Mental illness Mother   . Stroke Brother   . Hypertension Father     Health Maintenance  Topic Date Due  . PAP-Cervical Cytology Screening  01/28/2020  . PAP SMEAR-Modifier  01/28/2020  .  TETANUS/TDAP  09/27/2025  . INFLUENZA VACCINE  Completed  . HIV Screening  Discontinued    ----------------------------------------------------------------------------------------------------------------------------------------------------------------------------------------------------------------- Physical Exam BP 108/64   Pulse 88   Temp 97.9 F (36.6 C) (Oral)   Ht 5' 3.5" (1.613 m)   Wt 114 lb (51.7 kg)   SpO2 99%   BMI 19.88 kg/m   Physical Exam Constitutional:      Appearance: Normal appearance.  HENT:     Head: Normocephalic and atraumatic.     Mouth/Throat:     Mouth: Mucous membranes are moist.  Eyes:     General: No scleral icterus. Neck:     Musculoskeletal: Normal range of motion and neck supple.  Cardiovascular:     Rate and Rhythm: Normal rate and regular rhythm.     Heart sounds: Normal heart sounds.  Pulmonary:     Effort: Pulmonary effort is normal.     Breath  sounds: Normal breath sounds.  Skin:    General: Skin is warm and dry.  Neurological:     General: No focal deficit present.     Mental Status: She is alert.  Psychiatric:        Mood and Affect: Mood normal.        Behavior: Behavior normal.     ------------------------------------------------------------------------------------------------------------------------------------------------------------------------------------------------------------------- Assessment and Plan  Hair loss Borderline thyroid testing previously, update labs today.   Screening examination for pulmonary tuberculosis Quantiferon gold ordered as she needs this for her job.   Anxiety state Well managed with vistaril, continue.  Medication refilled.   Attention deficit hyperactivity disorder (ADHD) -She did better with adderall xr 30mg  with 10mg  IR in afternoon with less insomnia.  Rx updated from 40mg  to 30mg  of adderall xr.  -UDS ordered, controlled medication contract signed and PDMP reviewed and appropriate.

## 2019-01-12 NOTE — Assessment & Plan Note (Signed)
-  She did better with adderall xr 30mg  with 10mg  IR in afternoon with less insomnia.  Rx updated from 40mg  to 30mg  of adderall xr.  -UDS ordered, controlled medication contract signed and PDMP reviewed and appropriate.

## 2019-01-14 LAB — QUANTIFERON-TB GOLD PLUS
Mitogen-NIL: 6.54 IU/mL
NIL: 0.02 IU/mL
QuantiFERON-TB Gold Plus: NEGATIVE
TB1-NIL: 0.01 [IU]/mL
TB2-NIL: 0.01 IU/mL

## 2019-01-15 MED ORDER — CYCLOBENZAPRINE HCL 5 MG PO TABS: 5.0000 mg | ORAL_TABLET | Freq: Three times a day (TID) | ORAL | 0 refills | Status: DC | PRN

## 2019-01-18 LAB — TOXASSURE SELECT 13 (MW), URINE

## 2019-01-18 NOTE — Progress Notes (Signed)
Normal labs including negative quantiferon.  We'll continue to monitor thyroid function with routine lab work.

## 2019-01-26 ENCOUNTER — Telehealth: Payer: Self-pay | Admitting: Family Medicine

## 2019-01-26 NOTE — Telephone Encounter (Signed)
Phone note received approximately 8:30 PM on 01/25/2019.  Team health nurse, patient had been experiencing rectal bleeding but that was increasing and complained of 8 out of 10 abdominal pain.  Was advised to seek care in emergency room.  Agree with plan.

## 2019-01-27 ENCOUNTER — Encounter: Payer: Self-pay | Admitting: Family Medicine

## 2019-02-09 DIAGNOSIS — L281 Prurigo nodularis: Secondary | ICD-10-CM | POA: Diagnosis not present

## 2019-02-09 DIAGNOSIS — K130 Diseases of lips: Secondary | ICD-10-CM | POA: Diagnosis not present

## 2019-02-09 DIAGNOSIS — L709 Acne, unspecified: Secondary | ICD-10-CM | POA: Diagnosis not present

## 2019-02-09 DIAGNOSIS — Z5181 Encounter for therapeutic drug level monitoring: Secondary | ICD-10-CM | POA: Diagnosis not present

## 2019-03-13 ENCOUNTER — Encounter: Payer: Self-pay | Admitting: Family Medicine

## 2019-03-15 ENCOUNTER — Ambulatory Visit: Payer: 59 | Admitting: Family Medicine

## 2019-03-15 ENCOUNTER — Other Ambulatory Visit: Payer: Self-pay | Admitting: Family Medicine

## 2019-03-15 ENCOUNTER — Other Ambulatory Visit: Payer: Self-pay

## 2019-03-15 DIAGNOSIS — F909 Attention-deficit hyperactivity disorder, unspecified type: Secondary | ICD-10-CM

## 2019-03-15 MED ORDER — AMPHETAMINE-DEXTROAMPHETAMINE 10 MG PO TABS: 10.0000 mg | ORAL_TABLET | Freq: Every day | ORAL | 0 refills | Status: DC | PRN

## 2019-03-15 MED ORDER — AMPHETAMINE-DEXTROAMPHET ER 30 MG PO CP24: 30.0000 mg | ORAL_CAPSULE | Freq: Every day | ORAL | 0 refills | Status: DC

## 2019-03-15 NOTE — Progress Notes (Signed)
Rx refill Approved by Dr. Ashley Royalty.  Pending refill for his signature.

## 2019-03-16 ENCOUNTER — Telehealth: Payer: Self-pay | Admitting: Family Medicine

## 2019-03-16 NOTE — Telephone Encounter (Signed)
Called patient and left vm about appointment for 04/13/2019. Appointment needs to be changed to virtual visit.

## 2019-04-13 ENCOUNTER — Encounter: Payer: Self-pay | Admitting: Family Medicine

## 2019-04-13 ENCOUNTER — Telehealth (INDEPENDENT_AMBULATORY_CARE_PROVIDER_SITE_OTHER): Payer: 59 | Admitting: Family Medicine

## 2019-04-13 DIAGNOSIS — F909 Attention-deficit hyperactivity disorder, unspecified type: Secondary | ICD-10-CM | POA: Diagnosis not present

## 2019-04-13 DIAGNOSIS — L700 Acne vulgaris: Secondary | ICD-10-CM | POA: Diagnosis not present

## 2019-04-13 DIAGNOSIS — Z5181 Encounter for therapeutic drug level monitoring: Secondary | ICD-10-CM | POA: Diagnosis not present

## 2019-04-13 DIAGNOSIS — F411 Generalized anxiety disorder: Secondary | ICD-10-CM

## 2019-04-13 MED ORDER — AMPHETAMINE-DEXTROAMPHET ER 30 MG PO CP24: 30.0000 mg | ORAL_CAPSULE | Freq: Every day | ORAL | 0 refills | Status: DC

## 2019-04-13 MED ORDER — AMPHETAMINE-DEXTROAMPHETAMINE 10 MG PO TABS: 10.0000 mg | ORAL_TABLET | Freq: Every day | ORAL | 0 refills | Status: DC | PRN

## 2019-04-13 NOTE — Progress Notes (Signed)
Misty Ayers - 28 y.o. female MRN [id number]  Date of birth: 11/04/1991   This visit type was conducted due to national recommendations for restrictions regarding the COVID-19 Pandemic (e.g. social distancing).  This format is felt to be most appropriate for this patient at this time.  All issues noted in this document were discussed and addressed.  No physical exam was performed (except for noted visual exam findings with Video Visits).  I discussed the limitations of evaluation and management by telemedicine and the availability of in person appointments. The patient expressed understanding and agreed to proceed.  I connected with@ on 04/13/19 at  9:45 AM EDT by a video enabled telemedicine application and verified that I am speaking with the correct person using two identifiers.   Patient Location: Home [address] [place] [id number]   [facility]  Chief Complaint  Patient presents with  . Follow-up    3 F/U [facility] , Med refills. needs a note for disability accomadations for [employer]    HPI  Misty Ayers is a 28 y.o. female who presents via audio/video conferencing for a telehealth visit today.  She is following up today for ADHD.  Current management with adderall 30mg  xr in the morning with adderall IR 10mg  as needed in the evenings.  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  She needs a letter of accomodation for her ADHD.  She denies side effects from medication.  She will on occasion have some insomnia if she takes XR capsule too late in the day.  She denies chest pain, shortness of breath, palpitations, headache or vision changes.  Her anxiety is fairly well controlled.  She did unfortunately lose her grandfather recently but is grieving appropriately.  She is considering seeing a Veterinary surgeoncounselor.    ROS:  A comprehensive ROS was completed and negative except as noted per HPI  Past Medical History:  Diagnosis Date  .  Anemia   . Anxiety   . Depression     No past surgical history on file.  Family History  Problem Relation Age of Onset  . Stroke Mother   . Hyperlipidemia Mother   . Hypertension Mother   . Mental illness Mother   . Stroke Brother   . Hypertension Father     Social History   Socioeconomic History  . Marital status: Single    Spouse name: Not on file  . Number of children: Not on file  . Years of education: college  . Highest education level: Not on file  Occupational History  . Not on file  Social Needs  . Financial resource strain: Not on file  . Food insecurity:    Worry: Not on file    Inability: Not on file  . Transportation needs:    Medical: Not on file    Non-medical: Not on file  Tobacco Use  . Smoking status: Never Smoker  . Smokeless tobacco: Never Used  Substance and Sexual Activity  . Alcohol use: Yes    Comment: 3-5  . Drug use: No  . Sexual activity: Never    Birth control/protection: Abstinence  Lifestyle  . Physical activity:    Days per week: Not on file    Minutes per session: Not on file  . Stress: Not on file  Relationships  . Social connections:    Talks on phone: Not on file    Gets together: Not on  file    Attends religious service: Not on file    Active member of club or organization: Not on file    Attends meetings of clubs or organizations: Not on file    Relationship status: Not on file  . Intimate partner violence:    Fear of current or ex partner: Not on file    Emotionally abused: Not on file    Physically abused: Not on file    Forced sexual activity: Not on file  Other Topics Concern  . Not on file  Social History Narrative   Exercises     Current Outpatient Medications:  .  amphetamine-dextroamphetamine (ADDERALL XR) 30 MG 24 hr capsule, Take 1 capsule (30 mg total) by mouth daily., Disp: 30 capsule, Rfl: 0 .  amphetamine-dextroamphetamine (ADDERALL) 10 MG tablet, Take 1 tablet (10 mg total) by mouth daily as  needed., Disp: 30 tablet, Rfl: 0 .  etonogestrel (NEXPLANON) 68 MG IMPL implant, 1 each by Subdermal route once., Disp: , Rfl:  .  ISOtretinoin (AMNESTEEM) 40 MG capsule, Take 40 mg by mouth 2 (two) times daily., Disp: , Rfl:   EXAM:  VITALS per patient if applicable: BP 126/62 Comment: taken at work  Pulse 62   Temp 97.6 F (36.4 C) (Oral)   Ht 5' 2.5" (1.588 m)   Wt 106 lb 4.8 oz (48.2 kg) Comment: pt wt @ hm this am  BMI 19.13 kg/m   GENERAL: alert, oriented, appears well and in no acute distress  HEENT: atraumatic, conjunttiva clear, no obvious abnormalities on inspection of external nose and ears  NECK: normal movements of the head and neck  LUNGS: on inspection no signs of respiratory distress, breathing rate appears normal, no obvious gross SOB, gasping or wheezing  CV: no obvious cyanosis  MS: moves all visible extremities without noticeable abnormality  PSYCH/NEURO: pleasant and cooperative, no obvious depression or anxiety, speech and thought processing grossly intact  ASSESSMENT AND PLAN:  Discussed the following assessment and plan:  Anxiety state -Stable for the most part, does not want to restart medication.  Encouraged to consider seeing a counselor.    Attention deficit hyperactivity disorder (ADHD) -Stable with current medication -PDMP reviewed -Medications renewed -F/u in 3 months.        I discussed the assessment and treatment plan with the patient. The patient was provided an opportunity to ask questions and all were answered. The patient agreed with the plan and demonstrated an understanding of the instructions.   The patient was advised to call back or seek an in-person evaluation if the symptoms worsen or if the condition fails to improve as anticipated.     Everrett Coombe, DO

## 2019-04-13 NOTE — Assessment & Plan Note (Signed)
-  Stable for the most part, does not want to restart medication.  Encouraged to consider seeing a counselor.

## 2019-04-13 NOTE — Assessment & Plan Note (Signed)
-  Stable with current medication -PDMP reviewed -Medications renewed -F/u in 3 months.

## 2019-04-18 ENCOUNTER — Encounter: Payer: Self-pay | Admitting: Family Medicine

## 2019-06-27 ENCOUNTER — Other Ambulatory Visit: Payer: Self-pay | Admitting: Family Medicine

## 2019-06-27 DIAGNOSIS — F909 Attention-deficit hyperactivity disorder, unspecified type: Secondary | ICD-10-CM

## 2019-06-29 ENCOUNTER — Telehealth: Payer: Self-pay | Admitting: Family Medicine

## 2019-06-29 ENCOUNTER — Other Ambulatory Visit: Payer: Self-pay

## 2019-06-29 NOTE — Telephone Encounter (Signed)
Tried to call pt to try and schedule pt for 3 month follow up around 07/13/19, once done they still wont fill med until 07/14/19. Had to leave message

## 2019-06-29 NOTE — Telephone Encounter (Signed)
Pt called and was trying to get amphetamine-dextroamphetamine (ADDERALL) 10 MG tablet and amphetamine-dextroamphetamine (ADDERALL XR) 30 MG 24 hr capsule filled but she said it was denied but she wasn't sure why, I told her I would message the nurse and see what was going on

## 2019-07-01 NOTE — Telephone Encounter (Signed)
Will ask Dr. Zigmund Daniel on Monday , when he returns back , about refills and OV .

## 2019-07-04 ENCOUNTER — Other Ambulatory Visit: Payer: Self-pay | Admitting: Family Medicine

## 2019-07-04 DIAGNOSIS — F909 Attention-deficit hyperactivity disorder, unspecified type: Secondary | ICD-10-CM

## 2019-07-04 MED ORDER — AMPHETAMINE-DEXTROAMPHET ER 30 MG PO CP24: 30.0000 mg | ORAL_CAPSULE | Freq: Every day | ORAL | 0 refills | Status: DC

## 2019-07-04 MED ORDER — AMPHETAMINE-DEXTROAMPHETAMINE 10 MG PO TABS: 10.0000 mg | ORAL_TABLET | Freq: Every day | ORAL | 0 refills | Status: DC | PRN

## 2019-07-04 NOTE — Telephone Encounter (Signed)
Have not gotten any refill requests for this medication and it was last filled in May.  New rx sent in. Perhaps refill request may have been sent to previous provider's office.

## 2019-07-05 NOTE — Telephone Encounter (Signed)
Pt aware of prescription refills sent in as requested.

## 2019-07-13 ENCOUNTER — Ambulatory Visit: Payer: 59 | Admitting: Family Medicine

## 2019-07-13 ENCOUNTER — Encounter: Payer: Self-pay | Admitting: Family Medicine

## 2019-07-13 DIAGNOSIS — F909 Attention-deficit hyperactivity disorder, unspecified type: Secondary | ICD-10-CM

## 2019-07-13 DIAGNOSIS — F411 Generalized anxiety disorder: Secondary | ICD-10-CM | POA: Diagnosis not present

## 2019-07-13 NOTE — Assessment & Plan Note (Signed)
-  anxiety is stable, she will continue following with therapist

## 2019-07-13 NOTE — Patient Instructions (Signed)
Great to see you! Follow up with me in 3 months.  Good luck with school!

## 2019-07-13 NOTE — Assessment & Plan Note (Signed)
-  Stable with current medication, will plant to continue.  -F/u 3 months, repeat UDS at that time.

## 2019-07-13 NOTE — Progress Notes (Signed)
Misty Ayers - 28 y.o. female MRN [id number]  Date of birth: 05/25/1991  Subjective Chief Complaint  Patient presents with  . Follow-up    3 mo f/u     HPI Misty Ayers is a 28 y.o. female here today for follow up of ADD.  She reports that she is still continuing to do well with current dosing of adderall.  She is taking afternoon dose a little more often as she is studying more for school.  She denies side effects other than some mild insomnia if taking medication later in the afternoon.  Her anxiety is manageable at this time and she is seeing a therapist.    ROS:  A comprehensive ROS was completed and negative except as noted per HPI  Allergies  Allergen Reactions  . Penicillins     unknown    Past Medical History:  Diagnosis Date  . Anemia   . Anxiety   . Depression     History reviewed. No pertinent surgical history.  Social History   Socioeconomic History  . Marital status: Single    Spouse name: Not on file  . Number of children: Not on file  . Years of education: college  . Highest education level: Not on file  Occupational History  . Not on file  Social Needs  . Financial resource strain: Not on file  . Food insecurity    Worry: Not on file    Inability: Not on file  . Transportation needs    Medical: Not on file    Non-medical: Not on file  Tobacco Use  . Smoking status: Never Smoker  . Smokeless tobacco: Never Used  Substance and Sexual Activity  . Alcohol use: Yes    Comment: 3-5  . Drug use: No  . Sexual activity: Never    Birth control/protection: Abstinence  Lifestyle  . Physical activity    Days per week: Not on file    Minutes per session: Not on file  . Stress: Not on file  Relationships  . Social Musicianconnections    Talks on phone: Not on file    Gets together: Not on file    Attends religious service: Not on file    Active member of club or organization: Not on file    Attends meetings of clubs or organizations:  Not on file    Relationship status: Not on file  Other Topics Concern  . Not on file  Social History Narrative   Exercises    Family History  Problem Relation Age of Onset  . Stroke Mother   . Hyperlipidemia Mother   . Hypertension Mother   . Mental illness Mother   . Stroke Brother   . Hypertension Father     Health Maintenance  Topic Date Due  . INFLUENZA VACCINE  07/09/2019  . PAP-Cervical Cytology Screening  01/28/2020  . PAP SMEAR-Modifier  01/28/2020  . TETANUS/TDAP  09/27/2025  . HIV Screening  Discontinued    ----------------------------------------------------------------------------------------------------------------------------------------------------------------------------------------------------------------- Physical Exam BP 128/88   Pulse 84   Temp 98.5 F (36.9 C) (Oral)   Ht 5' 2.5" (1.588 m)   Wt 119 lb 12.8 oz (54.3 kg)   SpO2 100%   BMI 21.56 kg/m   Physical Exam Constitutional:      Appearance: Normal appearance.  HENT:     Head: Normocephalic and atraumatic.     Mouth/Throat:     Mouth: Mucous membranes are moist.  Eyes:     General: No  scleral icterus. Neck:     Musculoskeletal: Neck supple.  Cardiovascular:     Rate and Rhythm: Normal rate and regular rhythm.  Pulmonary:     Effort: Pulmonary effort is normal.     Breath sounds: Normal breath sounds.  Skin:    General: Skin is warm and dry.  Neurological:     General: No focal deficit present.     Mental Status: She is alert.  Psychiatric:        Mood and Affect: Mood normal.        Behavior: Behavior normal.     ------------------------------------------------------------------------------------------------------------------------------------------------------------------------------------------------------------------- Assessment and Plan  Attention deficit hyperactivity disorder (ADHD) -Stable with current medication, will plant to continue.  -F/u 3 months, repeat UDS  at that time.   Anxiety state -anxiety is stable, she will continue following with therapist

## 2019-08-19 ENCOUNTER — Encounter: Payer: BLUE CROSS/BLUE SHIELD | Admitting: Obstetrics & Gynecology

## 2019-09-11 ENCOUNTER — Other Ambulatory Visit: Payer: Self-pay | Admitting: Family Medicine

## 2019-09-11 DIAGNOSIS — F909 Attention-deficit hyperactivity disorder, unspecified type: Secondary | ICD-10-CM

## 2019-09-12 MED ORDER — AMPHETAMINE-DEXTROAMPHET ER 30 MG PO CP24: 30.0000 mg | ORAL_CAPSULE | Freq: Every day | ORAL | 0 refills | Status: DC

## 2019-09-12 MED ORDER — AMPHETAMINE-DEXTROAMPHETAMINE 10 MG PO TABS: 10.0000 mg | ORAL_TABLET | Freq: Every day | ORAL | 0 refills | Status: DC | PRN

## 2019-09-15 ENCOUNTER — Other Ambulatory Visit: Payer: Self-pay | Admitting: Family Medicine

## 2019-09-15 DIAGNOSIS — F909 Attention-deficit hyperactivity disorder, unspecified type: Secondary | ICD-10-CM

## 2019-09-15 NOTE — Telephone Encounter (Signed)
amphetamine-dextroamphetamine (ADDERALL XR) 30 mg 24 hr  amphetamine-dextroamphetamine (ADDERALL) 10 MG tablet    Send to [facility]

## 2019-09-21 ENCOUNTER — Ambulatory Visit (INDEPENDENT_AMBULATORY_CARE_PROVIDER_SITE_OTHER): Payer: 59 | Admitting: Obstetrics & Gynecology

## 2019-09-21 ENCOUNTER — Other Ambulatory Visit: Payer: Self-pay

## 2019-09-21 ENCOUNTER — Encounter: Payer: 59 | Admitting: Obstetrics & Gynecology

## 2019-09-21 ENCOUNTER — Encounter: Payer: Self-pay | Admitting: Obstetrics & Gynecology

## 2019-09-21 VITALS — BP 110/78 | Ht 62.0 in | Wt 125.0 lb

## 2019-09-21 DIAGNOSIS — Z3046 Encounter for surveillance of implantable subdermal contraceptive: Secondary | ICD-10-CM | POA: Diagnosis not present

## 2019-09-21 DIAGNOSIS — Z9889 Other specified postprocedural states: Secondary | ICD-10-CM

## 2019-09-21 DIAGNOSIS — Z01419 Encounter for gynecological examination (general) (routine) without abnormal findings: Secondary | ICD-10-CM

## 2019-09-21 NOTE — Progress Notes (Signed)
Misty Ayers 1991-02-17 [id number]   History:    28 y.o.  G0 Got Married this year.  NP.  Planning to do PHD.    RP: Established patient presenting for annual gyn exam   HPI: H/O CIN 3/Margins negative post LEEP 03/2017.  Colpo 08/2017 No dysplasia.  On Nexplanon x 06/2018.  Light menses.  No pelvic pain.  No pain with IC.  Breasts normal.  BMI 22.86.  Past medical history,surgical history, family history and social history were all reviewed and documented in the EPIC chart.  Gynecologic History Patient's last menstrual period was 08/22/2019. Contraception: Nexplanon Last Pap: 01/2017. Results were: ASCUS, cannot r/o HGSIL.  Colpo CIN 3.  LEEP margins negative 03/2017.  Colpo No dysplasia 08/2017.  HPV 16-18-45 neg. Last mammogram: Never Bone Density: Never Colonoscopy: Never  Obstetric History OB History  Gravida Para Term Preterm AB Living  0 0 0 0 0 0  SAB TAB Ectopic Multiple Live Births  0 0 0 0 0     ROS: A ROS was performed and pertinent positives and negatives are included in the history.  GENERAL: No fevers or chills. HEENT: No change in vision, no earache, sore throat or sinus congestion. NECK: No pain or stiffness. CARDIOVASCULAR: No chest pain or pressure. No palpitations. PULMONARY: No shortness of breath, cough or wheeze. GASTROINTESTINAL: No abdominal pain, nausea, vomiting or diarrhea, melena or bright red blood per rectum. GENITOURINARY: No urinary frequency, urgency, hesitancy or dysuria. MUSCULOSKELETAL: No joint or muscle pain, no back pain, no recent trauma. DERMATOLOGIC: No rash, no itching, no lesions. ENDOCRINE: No polyuria, polydipsia, no heat or cold intolerance. No recent change in weight. HEMATOLOGICAL: No anemia or easy bruising or bleeding. NEUROLOGIC: No headache, seizures, numbness, tingling or weakness. PSYCHIATRIC: No depression, no loss of interest in normal activity or change in sleep pattern.     Exam:   BP 110/78 (BP Location: Right  Arm, Patient Position: Sitting, Cuff Size: Normal)   Ht 5\' 2"  (1.575 m)   Wt 125 lb (56.7 kg)   LMP 08/22/2019   BMI 22.86 kg/m   Body mass index is 22.86 kg/m.  General appearance : Well developed well nourished female. No acute distress HEENT: Eyes: no retinal hemorrhage or exudates,  Neck supple, trachea midline, no carotid bruits, no thyroidmegaly Lungs: Clear to auscultation, no rhonchi or wheezes, or rib retractions  Heart: Regular rate and rhythm, no murmurs or gallops Breast:Examined in sitting and supine position were symmetrical in appearance, no palpable masses or tenderness,  no skin retraction, no nipple inversion, no nipple discharge, no skin discoloration, no axillary or supraclavicular lymphadenopathy Abdomen: no palpable masses or tenderness, no rebound or guarding Extremities: no edema or skin discoloration or tenderness  Pelvic: Vulva: Normal             Vagina: No gross lesions or discharge  Cervix: No gross lesions or discharge.  Pap/HPV HR done  Uterus  AV, normal size, shape and consistency, non-tender and mobile  Adnexa  Without masses or tenderness  Anus: Normal   Assessment/Plan:  28 y.o. female for annual exam   1. Encounter for routine gynecological examination with Papanicolaou smear of cervix Normal gynecologic exam.  Pap test with high-risk HPV done.  Breast exam normal.  Body mass index 22.86.  Continue with fitness and healthy nutrition.  2. Encounter for surveillance of implantable subdermal contraceptive Nexplanon since July 2019.  Will remove when ready to conceive.  3. H/O LEEP CIN-3  with margins negative in April 2018.  Colpo September 2018 no dysplasia.  Pap test with high-risk HPV done today.  [name], 2:42 PM 09/21/2019

## 2019-09-23 LAB — PAP, TP IMAGING W/ HPV RNA, RFLX HPV TYPE 16,18/45: HPV DNA High Risk: NOT DETECTED

## 2019-09-29 ENCOUNTER — Encounter: Payer: Self-pay | Admitting: Obstetrics & Gynecology

## 2019-09-29 NOTE — Patient Instructions (Signed)
1. Encounter for routine gynecological examination with Papanicolaou smear of cervix Normal gynecologic exam.  Pap test with high-risk HPV done.  Breast exam normal.  Body mass index 22.86.  Continue with fitness and healthy nutrition.  2. Encounter for surveillance of implantable subdermal contraceptive Nexplanon since July 2019.  Will remove when ready to conceive.  3. H/O LEEP CIN-3 with margins negative in April 2018.  Colpo September 2018 no dysplasia.  Pap test with high-risk HPV done today.  Misty Ayers, it was a pleasure seeing you today!  I will inform you of your results as soon as they are available.

## 2019-10-26 ENCOUNTER — Other Ambulatory Visit: Payer: Self-pay | Admitting: Family Medicine

## 2019-10-26 DIAGNOSIS — F909 Attention-deficit hyperactivity disorder, unspecified type: Secondary | ICD-10-CM

## 2019-10-26 MED ORDER — AMPHETAMINE-DEXTROAMPHETAMINE 10 MG PO TABS: 10.0000 mg | ORAL_TABLET | Freq: Every day | ORAL | 0 refills | Status: DC | PRN

## 2019-10-26 MED ORDER — AMPHETAMINE-DEXTROAMPHET ER 30 MG PO CP24: 30.0000 mg | ORAL_CAPSULE | Freq: Every day | ORAL | 0 refills | Status: DC

## 2020-01-10 ENCOUNTER — Other Ambulatory Visit: Payer: Self-pay | Admitting: Family Medicine

## 2020-01-10 DIAGNOSIS — F909 Attention-deficit hyperactivity disorder, unspecified type: Secondary | ICD-10-CM

## 2020-01-10 NOTE — Telephone Encounter (Signed)
Please advise in absence of Dr. Ashley Royalty. Last ov was 07/13/2019--suppose to follow up in  3 mo Last refill for both med were: 12/25/2018 for 30 tab each PMP checked last 3 pick up from [facility] for 30 tab each were:   10/27/2019, 09/19/2019, 07/04/2019---Adderall 30 mg 10/26/2019, 09/15/2019, 07/04/2019--Adderall 10 mg.

## 2020-01-11 MED ORDER — AMPHETAMINE-DEXTROAMPHET ER 30 MG PO CP24: 30.0000 mg | ORAL_CAPSULE | Freq: Every day | ORAL | 0 refills | Status: DC

## 2020-01-11 MED ORDER — AMPHETAMINE-DEXTROAMPHETAMINE 10 MG PO TABS: 10.0000 mg | ORAL_TABLET | Freq: Every day | ORAL | 0 refills | Status: DC | PRN

## 2020-01-11 NOTE — Telephone Encounter (Signed)
Pt needs appt, either VV or in-person. I did refill both Rx for 30 days so pt has time to schedule appt. No further refills will be provided w/o an appt

## 2020-01-11 NOTE — Telephone Encounter (Signed)
Please help call and offer an appt with pt's provider of choice since Dr. Ashley Royalty is no longer here.

## 2020-01-12 ENCOUNTER — Other Ambulatory Visit: Payer: Self-pay | Admitting: Family Medicine

## 2020-01-12 DIAGNOSIS — F909 Attention-deficit hyperactivity disorder, unspecified type: Secondary | ICD-10-CM

## 2020-01-13 NOTE — Telephone Encounter (Signed)
Dr. Salena Saner just sent in 30 tab on both medicine on 01/11/2020.

## 2020-01-13 NOTE — Telephone Encounter (Signed)
Why was this denied?

## 2020-01-13 NOTE — Telephone Encounter (Signed)
I called patient and left message to call office to establish with another provider due to Dr. Ashley Royalty no longer at this practice.

## 2020-01-13 NOTE — Telephone Encounter (Signed)
Misty Ayers please help denied rx, Dr. Salena Saner sent in 30 day supply for both on 01/11/2020.

## 2020-01-18 ENCOUNTER — Telehealth (INDEPENDENT_AMBULATORY_CARE_PROVIDER_SITE_OTHER): Payer: 59 | Admitting: Family Medicine

## 2020-01-18 ENCOUNTER — Encounter: Payer: Self-pay | Admitting: Family Medicine

## 2020-01-18 DIAGNOSIS — F909 Attention-deficit hyperactivity disorder, unspecified type: Secondary | ICD-10-CM

## 2020-01-18 MED ORDER — AMPHETAMINE-DEXTROAMPHETAMINE 10 MG PO TABS: 10.0000 mg | ORAL_TABLET | Freq: Two times a day (BID) | ORAL | 0 refills | Status: DC

## 2020-01-18 NOTE — Progress Notes (Signed)
Virtual Visit via Video Note  I connected with Alison Meaghen Vecchiarelli on 01/18/20 at  1:30 PM EST by a video enabled telemedicine application and verified that I am speaking with the correct person using two identifiers. Location patient: home Location provider: work Persons participating in the virtual visit: patient, provider  I discussed the limitations of evaluation and management by telemedicine and the availability of in person appointments. The patient expressed understanding and agreed to proceed.  Chief Complaint  Patient presents with  . Medication Refill    Adderall XR.  pt would like to dicuss taking Adderall 10mg  BId instead of taking the Adderall 30mg , due to it interfering with her sleep at night.     HPI: Misty Ayers is a 29 y.o. female who was previously a patient of Dr. Daphine Deutscher who is seen today for f/u on ADHD. Pt is currently Rx'd Adderall XR 30mg  daily and Adderall 10mg . She is only taking Adderall XR 30mg  2-3x/wk and is mostly taking adderall 10mg  1-2x/day. She feels that she does not sleep well on the days when she takes 30mg .  Appetite is good.  She would like to stop the XR 30mg  and try adderall 10mg  BID.   Past Medical History:  Diagnosis Date  . Anemia   . Anxiety   . Depression     History reviewed. No pertinent surgical history.  Family History  Problem Relation Age of Onset  . Stroke Mother   . Hyperlipidemia Mother   . Hypertension Mother   . Mental illness Mother   . Stroke Brother   . Hypertension Father     Social History   Tobacco Use  . Smoking status: Never Smoker  . Smokeless tobacco: Never Used  Substance Use Topics  . Alcohol use: Yes    Comment: 3-5  . Drug use: No     Current Outpatient Medications:  .  amphetamine-dextroamphetamine (ADDERALL XR) 30 MG 24 hr capsule, Take 1 capsule (30 mg total) by mouth daily., Disp: 30 capsule, Rfl: 0 .  amphetamine-dextroamphetamine (ADDERALL) 10 MG tablet, Take 1 tablet  (10 mg total) by mouth daily as needed., Disp: 30 tablet, Rfl: 0 .  etonogestrel (NEXPLANON) 68 MG IMPL implant, 1 each by Subdermal route once., Disp: , Rfl:   Allergies  Allergen Reactions  . Penicillins     unknown      ROS: See pertinent positives and negatives per HPI.   EXAM:  VITALS per patient if applicable: Pulse 88   Temp 97.6 F (36.4 C) (Oral)   Wt 123 lb 9.6 oz (56.1 kg)   BMI 22.61 kg/m    GENERAL: alert, oriented, appears well and in no acute distress  NECK: normal movements of the head and neck  LUNGS: on inspection no signs of respiratory distress, breathing rate appears normal, no obvious gross SOB, gasping or wheezing, no conversational dyspnea  CV: no obvious cyanosis  PSYCH/NEURO: pleasant and cooperative, no obvious depression or anxiety, speech and thought processing grossly intact   ASSESSMENT AND PLAN:  1. Attention deficit hyperactivity disorder (ADHD), unspecified ADHD type - stop Adderall XR 30mg  daily - this is making it difficult for pt to sleep - pt has been taking just adderall 10mg  daily or BID and would like to continue with this regimen for now Refill: - amphetamine-dextroamphetamine (ADDERALL) 10 MG tablet; Take 1 tablet (10 mg total) by mouth 2 (two) times daily with a meal.  Dispense: 60 tablet; Refill: 0 - PMP reviewed and appropriate -  UDS UTD - controlled substance agreement on file and UTD - f/u in 3 mo or sooner PRN    I discussed the assessment and treatment plan with the patient. The patient was provided an opportunity to ask questions and all were answered. The patient agreed with the plan and demonstrated an understanding of the instructions.   The patient was advised to call back or seek an in-person evaluation if the symptoms worsen or if the condition fails to improve as anticipated.   Letta Median, DO

## 2020-02-09 ENCOUNTER — Other Ambulatory Visit: Payer: Self-pay | Admitting: Family Medicine

## 2020-02-09 DIAGNOSIS — F909 Attention-deficit hyperactivity disorder, unspecified type: Secondary | ICD-10-CM

## 2020-02-09 MED ORDER — AMPHETAMINE-DEXTROAMPHETAMINE 10 MG PO TABS: 10.0000 mg | ORAL_TABLET | Freq: Two times a day (BID) | ORAL | 0 refills | Status: DC

## 2020-02-09 NOTE — Telephone Encounter (Signed)
Last OV/VV 01/18/20 Last fill 01/18/20  #60/0

## 2020-02-15 ENCOUNTER — Encounter: Payer: 59 | Admitting: Family Medicine

## 2020-03-21 ENCOUNTER — Ambulatory Visit: Payer: 59 | Admitting: Obstetrics & Gynecology

## 2020-04-04 ENCOUNTER — Telehealth: Payer: Self-pay | Admitting: General Practice

## 2020-04-04 ENCOUNTER — Other Ambulatory Visit: Payer: Self-pay | Admitting: Family Medicine

## 2020-04-04 DIAGNOSIS — Z111 Encounter for screening for respiratory tuberculosis: Secondary | ICD-10-CM

## 2020-04-04 NOTE — Telephone Encounter (Addendum)
Patient is calling and wanted to get a copy of her all of her immunizations and labs. Also patient is requesting a TB Gold for school. Patient has a TOC with Dr. Salena Saner on 5/5.  CB is [phone]

## 2020-04-04 NOTE — Telephone Encounter (Signed)
Please advise.  Can pt come in this week for requested lab work?  She has an appointment with Dr. Salena Ayers on 04/11/20.

## 2020-04-04 NOTE — Telephone Encounter (Signed)
Lab order placed. She can make lab appt.

## 2020-04-04 NOTE — Telephone Encounter (Signed)
I spoke with pt and she informed me that she has scheduled lab work to be performed at a different office.

## 2020-04-10 ENCOUNTER — Ambulatory Visit: Payer: 59 | Admitting: Obstetrics & Gynecology

## 2020-04-10 ENCOUNTER — Other Ambulatory Visit: Payer: Self-pay

## 2020-04-11 ENCOUNTER — Encounter: Payer: Self-pay | Admitting: Family Medicine

## 2020-04-11 ENCOUNTER — Ambulatory Visit: Payer: 59 | Admitting: Family Medicine

## 2020-04-11 VITALS — BP 130/82 | HR 107 | Temp 98.1°F | Ht 62.0 in | Wt 128.6 lb

## 2020-04-11 DIAGNOSIS — D509 Iron deficiency anemia, unspecified: Secondary | ICD-10-CM

## 2020-04-11 DIAGNOSIS — F909 Attention-deficit hyperactivity disorder, unspecified type: Secondary | ICD-10-CM | POA: Diagnosis not present

## 2020-04-11 MED ORDER — AMPHETAMINE-DEXTROAMPHETAMINE 10 MG PO TABS: 10.0000 mg | ORAL_TABLET | Freq: Two times a day (BID) | ORAL | 0 refills | Status: DC

## 2020-04-11 NOTE — Progress Notes (Signed)
Misty Ayers is a 29 y.o. female  Chief Complaint  Patient presents with  . Establish Care    Pt here for TOC viist.  Med refill.  Pt would like discuss BC options.    HPI: Misty Ayers is a 29 y.o. female here for f/u on ADHD and refill of Adderall '10mg'$  BID.  She feels med is effective, no side effects. She is sleeping well, appetite ok.  No HA, CP, SOB, dizziness, lightheadedness.   She also wants to discuss [facility] options. She has nexplanon and is scheduled to have it removed next week (3 years).  She is married and does not want to start another form of BC. She was having heavy periods x 5-7 days and as a result iron deficiency anemia.  Hgb = 10.6 (finger stick) - 3 wks ago.   H/H in 06/2019 = 12/37.9 Not on iron supplement as she states it previously made her constipated.  Past Medical History:  Diagnosis Date  . Anemia   . Anxiety   . Depression     History reviewed. No pertinent surgical history.  Social History   Socioeconomic History  . Marital status: Single    Spouse name: Not on file  . Number of children: Not on file  . Years of education: college  . Highest education level: Not on file  Occupational History  . Not on file  Tobacco Use  . Smoking status: Never Smoker  . Smokeless tobacco: Never Used  Substance and Sexual Activity  . Alcohol use: Yes    Comment: 3-5  . Drug use: No  . Sexual activity: Yes    Birth control/protection: Implant  Other Topics Concern  . Not on file  Social History Narrative   Exercises   Social Determinants of Health   Financial Resource Strain:   . Difficulty of Paying Living Expenses:   Food Insecurity:   . Worried About Charity fundraiser in the Last Year:   . Arboriculturist in the Last Year:   Transportation Needs:   . Film/video editor (Medical):   Marland Kitchen Lack of Transportation (Non-Medical):   Physical Activity:   . Days of Exercise per Week:   . Minutes of Exercise per Session:     Stress:   . Feeling of Stress :   Social Connections:   . Frequency of Communication with Friends and Family:   . Frequency of Social Gatherings with Friends and Family:   . Attends Religious Services:   . Active Member of Clubs or Organizations:   . Attends Archivist Meetings:   Marland Kitchen Marital Status:   Intimate Partner Violence:   . Fear of Current or Ex-Partner:   . Emotionally Abused:   Marland Kitchen Physically Abused:   . Sexually Abused:     Family History  Problem Relation Age of Onset  . Stroke Mother   . Hyperlipidemia Mother   . Hypertension Mother   . Mental illness Mother   . Stroke Brother   . Hypertension Father      Immunization History  Administered Date(s) Administered  . Influenza,inj,Quad PF,6+ Mos 09/28/2015, 09/04/2016, 08/12/2017  . MMR 10/12/2015  . Tdap 09/28/2015    Outpatient Encounter Medications as of 04/11/2020  Medication Sig  . amphetamine-dextroamphetamine (ADDERALL) 10 MG tablet Take 1 tablet (10 mg total) by mouth 2 (two) times daily with a meal.  . etonogestrel (NEXPLANON) 68 MG IMPL implant 1 each by Subdermal route once.  No facility-administered encounter medications on file as of 04/11/2020.     ROS: Pertinent positives and negatives noted in HPI. Remainder of ROS non-contributory    Allergies  Allergen Reactions  . Penicillins     unknown    BP 130/82 (BP Location: Left Arm, Patient Position: Sitting, Cuff Size: Normal)   Pulse (!) 107   Temp 98.1 F (36.7 C) (Temporal)   Ht '5\' 2"'$  (1.575 m)   Wt 128 lb 9.6 oz (58.3 kg)   SpO2 99%   BMI 23.52 kg/m   Physical Exam  Constitutional: She is oriented to person, place, and time. She appears well-developed and well-nourished. No distress.  Cardiovascular: Normal rate and regular rhythm.  Pulmonary/Chest: No respiratory distress.  Neurological: She is alert and oriented to person, place, and time.  Psychiatric: She has a normal mood and affect. Her behavior is normal.      A/P:  1. Attention deficit hyperactivity disorder (ADHD), unspecified ADHD type - stable, controlled Refill: - amphetamine-dextroamphetamine (ADDERALL) 10 MG tablet; Take 1 tablet (10 mg total) by mouth 2 (two) times daily with a meal.  Dispense: 60 tablet; Refill: 0 - Tox[employer] Select 13 (MW), Urine - database reviewed and appropriate - controlled substance agreement on file - f/u in 3 mo or sooner PRN  2. Iron deficiency anemia, unspecified iron deficiency anemia type - not on iron supplement - CBC - Iron, TIBC and Ferritin Panel   This visit occurred during the SARS-CoV-2 public health emergency.  Safety protocols were in place, including screening questions prior to the visit, additional usage of staff PPE, and extensive cleaning of exam room while observing appropriate contact time as indicated for disinfecting solutions.

## 2020-04-11 NOTE — Patient Instructions (Addendum)
Iron (Vitron C, Slow Fe) 1 tab daily along with colace 100mg  1 cap daily  Prenatal vitamin with DHA - 1 daily

## 2020-04-12 LAB — IRON,TIBC AND FERRITIN PANEL
%SAT: 11 % (calc) — ABNORMAL LOW (ref 16–45)
Ferritin: 4 ng/mL — ABNORMAL LOW (ref 16–154)
Iron: 42 ug/dL (ref 40–190)
TIBC: 384 mcg/dL (calc) (ref 250–450)

## 2020-04-12 LAB — CBC
HCT: 34.2 % — ABNORMAL LOW (ref 36.0–46.0)
Hemoglobin: 11.2 g/dL — ABNORMAL LOW (ref 12.0–15.0)
MCHC: 32.7 g/dL (ref 30.0–36.0)
MCV: 86 fl (ref 78.0–100.0)
Platelets: 252 10*3/uL (ref 150.0–400.0)
RBC: 3.98 Mil/uL (ref 3.87–5.11)
RDW: 15.3 % (ref 11.5–15.5)
WBC: 5.8 10*3/uL (ref 4.0–10.5)

## 2020-04-13 LAB — TOXASSURE SELECT 13 (MW), URINE

## 2020-04-24 ENCOUNTER — Other Ambulatory Visit: Payer: Self-pay

## 2020-04-25 ENCOUNTER — Ambulatory Visit: Payer: 59 | Admitting: Obstetrics & Gynecology

## 2020-04-25 ENCOUNTER — Encounter: Payer: Self-pay | Admitting: Obstetrics & Gynecology

## 2020-04-25 VITALS — BP 150/90

## 2020-04-25 DIAGNOSIS — Z3046 Encounter for surveillance of implantable subdermal contraceptive: Secondary | ICD-10-CM

## 2020-04-25 DIAGNOSIS — Z3169 Encounter for other general counseling and advice on procreation: Secondary | ICD-10-CM | POA: Diagnosis not present

## 2020-04-25 NOTE — Patient Instructions (Addendum)
1. Encounter for Nexplanon removal Easy removal of Nexplanon.  No complication.  Well-tolerated by patient.  Postprocedure precautions reviewed.  Patient will use condoms until she is ready to attempt conception.  2. Encounter for preconception consultation Recommend to continue on a healthy nutrition with plenty of vegetables.  Aerobic activities 5 times a week and light weightlifting every 2 days.  Will start on prenatal vitamins.  Will wait at least 1 cycle after Nexplanon removal before attempting conception.  Sarit, it was a pleasure seeing you today!

## 2020-04-25 NOTE — Progress Notes (Signed)
    Misty Ayers 1991/01/31 [id number]        29 y.o.  G0 Married.  NP, planning a PHD.  RP: Removal of Nexplanon   HPI: Well on Nexplanon for 3 yrs, time to remove.  Would like to attempt conception in the near future.   OB History  Gravida Para Term Preterm AB Living  0 0 0 0 0 0  SAB TAB Ectopic Multiple Live Births  0 0 0 0 0    Past medical history,surgical history, problem list, medications, allergies, family history and social history were all reviewed and documented in the EPIC chart.   Directed ROS with pertinent positives and negatives documented in the history of present illness/assessment and plan.  Exam:  Vitals:   04/25/20 1411  BP: (!) 150/90   General appearance:  Normal                                                             Nexplanon procedure note (removal)  The patient presented to the office today requesting for removal of her Nexplanon that was placed in the year 04/2017 on her right arm.   On examination the nexplanon implant was palpated and the distal end  (end  closest to the elbow) was marked. The area was sterilized with Betadine solution. 1% lidocaine was used for local anesthesia and approximately 1 cc  was injected into the site that was marked where the incision was to be made. The local anesthetic was injected under the implant in an effort to keep it  close to the skin surface. Slight pressure pushing downward was made at the proximal end  of the implant in an effort to stabilize it. A bulge appeared indicating the distal end of the implant. A small transverse incision of 2 mm was made at that location. By gently pushing the implant toward the incision, the tip became visible. Grasping the implant with a curved forcep facilitated in gently removing the implant. Full confirmation of the entire implant which is 4 cm long was inspected and was intact and was shown to the patient and discarded. After removing the implant, the incision was  closed with 3Steri-Strips, a band-aid and a bandage. Patient will be instructed to remove the pressure bandage in 24 hours, the band-aid in 3 days and the Steri-Strips in 7 days.    Assessment/Plan:  29 y.o. G0  1. Encounter for Nexplanon removal Easy removal of Nexplanon.  No complication.  Well-tolerated by patient.  Postprocedure precautions reviewed.  Patient will use condoms until she is ready to attempt conception.  2. Encounter for preconception consultation Recommend to continue on a healthy nutrition with plenty of vegetables.  Aerobic activities 5 times a week and light weightlifting every 2 days.  Will start on prenatal vitamins.  Will wait at least 1 cycle after Nexplanon removal before attempting conception.  [name], 2:33 PM 04/25/2020

## 2020-05-25 ENCOUNTER — Other Ambulatory Visit: Payer: Self-pay

## 2020-05-28 ENCOUNTER — Ambulatory Visit: Payer: 59 | Admitting: Nurse Practitioner

## 2020-05-29 ENCOUNTER — Telehealth: Payer: Self-pay | Admitting: Family Medicine

## 2020-05-29 NOTE — Telephone Encounter (Signed)
I called pt back to inform her that Dr. Salena Saner would be in tomorrow to answer the question and that I had sent Dr. Salena Saner the prior message before this one.  Pt said that it has been 5 days that she has been off medication and that she wanted any provider to tell her other options for pg women so that she can give information to her OB.  I informed pt that it would be best to get this opinion from her PCP but pt just talked over me and said she understands that Dr. Salena Saner is not here today but can I ask another provider what medications she can take in place of Adderall, so she can give the information to her OB so her OB can approve it.  Please advise.

## 2020-05-29 NOTE — Telephone Encounter (Signed)
Patient is calling to speak to clinical staff regarding her Adderall. She recently found out that she was expecting and her OB said that it's not safe for her to continue taking this medication at this time. She wants to know what else it can be switched to so that she can let her OB know and get it approved. Once approved by her OB she will need Dr. Salena Saner to send in a prescription for the new medication. Please call her at [phone] if you have any questions.  Thank you, Antony Blackbird

## 2020-05-29 NOTE — Telephone Encounter (Signed)
Patient called to check status of this message and I let him know what Dr. Doreene Burke said.

## 2020-05-29 NOTE — Telephone Encounter (Signed)
Please see message and advise.  Thank you. ° °

## 2020-05-29 NOTE — Telephone Encounter (Signed)
I know of no meds for ADHD that should be taken during pregnancy.

## 2020-05-29 NOTE — Telephone Encounter (Signed)
Patient's husband called and states that she is [redacted] weeks pregnant and her OBGYN recommended she stop taking Adderall. Patient would like other medication options to replace the Adderall.

## 2020-05-30 NOTE — Telephone Encounter (Signed)
I called pt back and informed her of what Dr. Salena Saner feedback was.  Pt was still addiment about Dr. Salena Saner being the one whom she should follow up with about her ADHD.  I scheduled her a virtual visit with Dr. Salena Saner on Friday to further discuss.

## 2020-05-30 NOTE — Telephone Encounter (Signed)
I am not aware of any ADHD meds that are safe (category B) to take in pregnancy. Strattera may be an option but I would recommend pt ask GYN for referral to high risk/maternal fetal med to discuss.

## 2020-06-01 ENCOUNTER — Telehealth: Payer: 59 | Admitting: Family Medicine

## 2020-06-01 NOTE — Progress Notes (Deleted)
Virtual Visit via Video Note  I connected with Aleicia Samreen Seltzer on 06/01/20 at  4:00 PM EDT by a video enabled telemedicine application and verified that I am speaking with the correct person using two identifiers. Location patient: home Location provider: work Persons participating in the virtual visit: patient, provider  I discussed the limitations of evaluation and management by telemedicine and the availability of in person appointments. The patient expressed understanding and agreed to proceed.  No chief complaint on file.    HPI: Misty Ayers is a 29 y.o. female seen today to discuss ADHD and medication in the setting of her current pregnancy. Pt is about *** weeks pregnant and following with *** She has been on Adderall 10mg  BID for her ADHD, but was told by OB that she cannot take this med during pregnancy.    Past Medical History:  Diagnosis Date  . Anemia   . Anxiety   . Depression     No past surgical history on file.  Family History  Problem Relation Age of Onset  . Stroke Mother   . Hyperlipidemia Mother   . Hypertension Mother   . Mental illness Mother   . Stroke Brother   . Hypertension Father     Social History   Tobacco Use  . Smoking status: Never Smoker  . Smokeless tobacco: Never Used  Vaping Use  . Vaping Use: Never used  Substance Use Topics  . Alcohol use: Yes    Comment: 3-5  . Drug use: No     Current Outpatient Medications:  .  amphetamine-dextroamphetamine (ADDERALL) 10 MG tablet, Take 1 tablet (10 mg total) by mouth 2 (two) times daily with a meal., Disp: 60 tablet, Rfl: 0 .  etonogestrel (NEXPLANON) 68 MG IMPL implant, 1 each by Subdermal route once., Disp: , Rfl:   Allergies  Allergen Reactions  . Penicillins     unknown      ROS: See pertinent positives and negatives per HPI.   EXAM:  VITALS per patient if applicable: There were no vitals taken for this visit.   GENERAL: alert, oriented, appears  well and in no acute distress  NECK: normal movements of the head and neck  LUNGS: on inspection no signs of respiratory distress, breathing rate appears normal, no obvious gross SOB, gasping or wheezing, no conversational dyspnea  CV: no obvious cyanosis  PSYCH/NEURO: pleasant and cooperative, speech and thought processing grossly intact   ASSESSMENT AND PLAN:       I discussed the assessment and treatment plan with the patient. The patient was provided an opportunity to ask questions and all were answered. The patient agreed with the plan and demonstrated an understanding of the instructions.   The patient was advised to call back or seek an in-person evaluation if the symptoms worsen or if the condition fails to improve as anticipated.   , DO

## 2020-07-02 ENCOUNTER — Other Ambulatory Visit: Payer: Self-pay | Admitting: Family Medicine

## 2020-07-02 DIAGNOSIS — F909 Attention-deficit hyperactivity disorder, unspecified type: Secondary | ICD-10-CM

## 2020-07-03 NOTE — Telephone Encounter (Signed)
Last OV 04/11/20 Last fill 04/11/20  #60/0

## 2020-07-04 MED ORDER — AMPHETAMINE-DEXTROAMPHETAMINE 10 MG PO TABS: 10.0000 mg | ORAL_TABLET | Freq: Two times a day (BID) | ORAL | 0 refills | Status: DC

## 2020-07-11 ENCOUNTER — Encounter: Payer: Self-pay | Admitting: Family Medicine

## 2020-07-11 ENCOUNTER — Telehealth (INDEPENDENT_AMBULATORY_CARE_PROVIDER_SITE_OTHER): Payer: 59 | Admitting: Family Medicine

## 2020-07-11 DIAGNOSIS — F909 Attention-deficit hyperactivity disorder, unspecified type: Secondary | ICD-10-CM | POA: Diagnosis not present

## 2020-07-11 MED ORDER — AMPHETAMINE-DEXTROAMPHETAMINE 10 MG PO TABS: 10.0000 mg | ORAL_TABLET | Freq: Two times a day (BID) | ORAL | 0 refills | Status: DC

## 2020-07-11 NOTE — Patient Instructions (Signed)
Health Maintenance Due  Topic Date Due  . Hepatitis C Screening  Never done  . COVID-19 Vaccine (1) Never done  . INFLUENZA VACCINE  07/08/2020    Depression screen [facility] 2/9 01/12/2019 06/30/2018 06/24/2018  Decreased Interest 0 0 0  Down, Depressed, Hopeless 0 0 0  PHQ - 2 Score 0 0 0  Altered sleeping 1 - -  Tired, decreased energy 2 - -  Change in appetite 1 - -  Feeling bad or failure about yourself  0 - -  Trouble concentrating 0 - -  Moving slowly or fidgety/restless 1 - -  Suicidal thoughts 0 - -  PHQ-9 Score 5 - -

## 2020-07-11 NOTE — Progress Notes (Signed)
Virtual Visit via Video Note  I connected with Misty Ayers on 07/11/20 at  1:30 PM EDT by a video enabled telemedicine application and verified that I am speaking with the correct person using two identifiers. Location patient: home Location provider: work or home office Persons participating in the virtual visit: patient, provider  I discussed the limitations of evaluation and management by telemedicine and the availability of in person appointments. The patient expressed understanding and agreed to proceed.  Chief Complaint  Patient presents with  . Follow-up    3 month medication follow up    HPI: Misty Ayers is a 29 y.o. female seen today for f/u on ADHD and refill of medication - Adderall 10mg  BID. Pt feels med is effective, controls symptoms. Pt states she feels "it is working really well". Appetite, sleep are good. Denies HA, dizziness, CP, palpitations.   Past Medical History:  Diagnosis Date  . Anemia   . Anxiety   . Depression     History reviewed. No pertinent surgical history.  Family History  Problem Relation Age of Onset  . Stroke Mother   . Hyperlipidemia Mother   . Hypertension Mother   . Mental illness Mother   . Stroke Brother   . Hypertension Father     Social History   Tobacco Use  . Smoking status: Never Smoker  . Smokeless tobacco: Never Used  Vaping Use  . Vaping Use: Never used  Substance Use Topics  . Alcohol use: Yes    Comment: 3-5  . Drug use: No     Current Outpatient Medications:  .  amphetamine-dextroamphetamine (ADDERALL) 10 MG tablet, Take 1 tablet (10 mg total) by mouth 2 (two) times daily with a meal., Disp: 60 tablet, Rfl: 0 .  ferrous sulfate 325 (65 FE) MG tablet, Take 325 mg by mouth daily with breakfast., Disp: , Rfl:  .  prenatal vitamin w/FE, FA (NATACHEW) 29-1 MG CHEW chewable tablet, Chew 1 tablet by mouth daily at 12 noon., Disp: , Rfl:  .  etonogestrel (NEXPLANON) 68 MG IMPL implant, 1  each by Subdermal route once., Disp: , Rfl:   Allergies  Allergen Reactions  . Penicillins     unknown      ROS: See pertinent positives and negatives per HPI.   EXAM:  VITALS per patient if applicable: BP 126/68 (BP Location: Left Arm, Patient Position: Sitting, Cuff Size: Normal)   Pulse (!) 101   Temp 97.8 F (36.6 C) (Temporal)   Ht 5\' 2"  (1.575 m)   Wt 125 lb 9.6 oz (57 kg)   LMP 06/29/2020   BMI 22.97 kg/m    GENERAL: alert, oriented, appears well and in no acute distress  NECK: normal movements of the head and neck  LUNGS: on inspection no signs of respiratory distress, breathing rate appears normal, no obvious gross SOB, gasping or wheezing, no conversational dyspnea  CV: no obvious cyanosis  PSYCH/NEURO: pleasant and cooperative, no obvious depression or anxiety, speech and thought processing grossly intact   ASSESSMENT AND PLAN: 1. Attention deficit hyperactivity disorder (ADHD), unspecified ADHD type - stable, controlled - pt feels current dose is effective - UDS UTD - database reviewed and appropriate Refill:  - amphetamine-dextroamphetamine (ADDERALL) 10 MG tablet; Take 1 tablet (10 mg total) by mouth 2 (two) times daily with a meal.  Dispense: 60 tablet; Refill: 0 - f/u in 3 mo or sooner PRN Discussed plan and reviewed medications with patient, including risks, benefits, and potential  side effects. Pt expressed understand. All questions answered.    I discussed the assessment and treatment plan with the patient. The patient was provided an opportunity to ask questions and all were answered. The patient agreed with the plan and demonstrated an understanding of the instructions.   The patient was advised to call back or seek an in-person evaluation if the symptoms worsen or if the condition fails to improve as anticipated.   Luana Shu, DO

## 2020-07-27 ENCOUNTER — Ambulatory Visit: Payer: 59 | Admitting: Obstetrics & Gynecology

## 2020-07-27 LAB — OB RESULTS CONSOLE ABO/RH: RH Type: POSITIVE

## 2020-08-19 ENCOUNTER — Inpatient Hospital Stay (HOSPITAL_COMMUNITY): Payer: 59

## 2020-08-19 ENCOUNTER — Inpatient Hospital Stay (HOSPITAL_COMMUNITY)
Admission: AD | Admit: 2020-08-19 | Discharge: 2020-08-19 | Disposition: A | Discharge status: Home / Self Care | Payer: 59 | Attending: Obstetrics and Gynecology | Admitting: Obstetrics and Gynecology

## 2020-08-19 ENCOUNTER — Other Ambulatory Visit: Payer: Self-pay

## 2020-08-19 ENCOUNTER — Encounter (HOSPITAL_COMMUNITY): Payer: Self-pay | Admitting: Obstetrics and Gynecology

## 2020-08-19 DIAGNOSIS — O3481 Maternal care for other abnormalities of pelvic organs, first trimester: Secondary | ICD-10-CM | POA: Diagnosis not present

## 2020-08-19 DIAGNOSIS — O4691 Antepartum hemorrhage, unspecified, first trimester: Secondary | ICD-10-CM

## 2020-08-19 DIAGNOSIS — Z79899 Other long term (current) drug therapy: Secondary | ICD-10-CM | POA: Insufficient documentation

## 2020-08-19 DIAGNOSIS — Z3A01 Less than 8 weeks gestation of pregnancy: Secondary | ICD-10-CM | POA: Diagnosis not present

## 2020-08-19 DIAGNOSIS — O209 Hemorrhage in early pregnancy, unspecified: Secondary | ICD-10-CM | POA: Diagnosis not present

## 2020-08-19 DIAGNOSIS — Z793 Long term (current) use of hormonal contraceptives: Secondary | ICD-10-CM | POA: Insufficient documentation

## 2020-08-19 DIAGNOSIS — Z6790 Unspecified blood type, Rh positive: Secondary | ICD-10-CM

## 2020-08-19 DIAGNOSIS — N83202 Unspecified ovarian cyst, left side: Secondary | ICD-10-CM | POA: Diagnosis not present

## 2020-08-19 DIAGNOSIS — O4690 Antepartum hemorrhage, unspecified, unspecified trimester: Secondary | ICD-10-CM

## 2020-08-19 LAB — WET PREP, GENITAL
Clue Cells Wet Prep HPF POC: NONE SEEN
Sperm: NONE SEEN
Trich, Wet Prep: NONE SEEN
Yeast Wet Prep HPF POC: NONE SEEN

## 2020-08-19 LAB — CBC
HCT: 35.8 % — ABNORMAL LOW (ref 36.0–46.0)
Hemoglobin: 12.2 g/dL (ref 12.0–15.0)
MCH: 29.4 pg (ref 26.0–34.0)
MCHC: 34.1 g/dL (ref 30.0–36.0)
MCV: 86.3 fL (ref 80.0–100.0)
Platelets: 243 10*3/uL (ref 150–400)
RBC: 4.15 MIL/uL (ref 3.87–5.11)
RDW: 12.1 % (ref 11.5–15.5)
WBC: 4.3 10*3/uL (ref 4.0–10.5)
nRBC: 0 % (ref 0.0–0.2)

## 2020-08-19 LAB — URINALYSIS, ROUTINE W REFLEX MICROSCOPIC
Bilirubin Urine: NEGATIVE
Glucose, UA: NEGATIVE mg/dL
Ketones, ur: NEGATIVE mg/dL
Leukocytes,Ua: NEGATIVE
Nitrite: NEGATIVE
Protein, ur: NEGATIVE mg/dL
Specific Gravity, Urine: 1.015 (ref 1.005–1.030)
pH: 7.5 (ref 5.0–8.0)

## 2020-08-19 LAB — ABO/RH: ABO/RH(D): O POS

## 2020-08-19 LAB — HCG, QUANTITATIVE, PREGNANCY: hCG, Beta Chain, Quant, S: 120996 m[IU]/mL — ABNORMAL HIGH (ref <5)

## 2020-08-19 LAB — URINALYSIS, MICROSCOPIC (REFLEX): WBC, UA: NONE SEEN WBC/hpf (ref 0–5)

## 2020-08-19 LAB — POCT PREGNANCY, URINE: Preg Test, Ur: POSITIVE — AB

## 2020-08-19 NOTE — Discharge Instructions (Signed)

## 2020-08-19 NOTE — MAU Note (Signed)
Misty Ayers is a 29 y.o. at Unknown here in MAU reporting: around 5 this AM had 1 really bad cramp and then less than an hour ago felt a gush of blood. States it was dark brown, red, and some white. States she is currently have left sided abdominal pain. + UPT on 8/14.  LMP: 06/29/20  Onset of complaint: today  Pain score: 5/10  Vitals:   08/19/20 0928  BP: 114/66  Pulse: 75  Resp: 16  Temp: 98.6 F (37 C)  SpO2: 100%     Lab orders placed from triage: UPT

## 2020-08-19 NOTE — MAU Provider Note (Signed)
History     CSN: [id number]  Arrival date and time: 08/19/20 8032   First Provider Initiated Contact with Patient 08/19/20 1212      Chief Complaint  Patient presents with  . Abdominal Pain  . Vaginal Bleeding  . Possible Pregnancy   29 y.o. G2P0010 @[redacted]w[redacted]d  presenting with VB and cramping. Reports onset of one painful cramp earlier this am. Some time later she noticed bright red blood coming from her vagina. Pain was 5/10 at the time, no longer present. No recent IC but did have orgasm this am.   OB History    Gravida  2   Para  0   Term  0   Preterm  0   AB  1   Living  0     SAB  1   TAB  0   Ectopic  0   Multiple  0   Live Births  0           Past Medical History:  Diagnosis Date  . Anemia   . Anxiety   . Depression     Past Surgical History:  Procedure Laterality Date  . LEEP      Family History  Problem Relation Age of Onset  . Stroke Mother   . Hyperlipidemia Mother   . Hypertension Mother   . Mental illness Mother   . Stroke Brother   . Hypertension Father   . Heart attack Maternal Grandfather     Social History   Tobacco Use  . Smoking status: Never Smoker  . Smokeless tobacco: Never Used  Vaping Use  . Vaping Use: Never used  Substance Use Topics  . Alcohol use: Not Currently    Comment: 3-5  . Drug use: No    Allergies:  Allergies  Allergen Reactions  . Penicillins     unknown    Medications Prior to Admission  Medication Sig Dispense Refill Last Dose  . amphetamine-dextroamphetamine (ADDERALL) 10 MG tablet Take 1 tablet (10 mg total) by mouth 2 (two) times daily with a meal. 60 tablet 0   . etonogestrel (NEXPLANON) 68 MG IMPL implant 1 each by Subdermal route once.     . ferrous sulfate 325 (65 FE) MG tablet Take 325 mg by mouth daily with breakfast.     . prenatal vitamin w/FE, FA (NATACHEW) 29-1 MG CHEW chewable tablet Chew 1 tablet by mouth daily at 12 noon.       Review of Systems  Gastrointestinal:  Positive for abdominal pain.  Genitourinary: Positive for vaginal bleeding.   Physical Exam   Blood pressure 114/66, pulse 75, temperature 98.6 F (37 C), temperature source Oral, resp. rate 16, height 5' 2.5" (1.588 m), weight 56.2 kg, last menstrual period 06/29/2020, SpO2 100 %.  Physical Exam Vitals and nursing note reviewed. Exam conducted with a chaperone present.  Constitutional:      Appearance: She is well-developed.  HENT:     Head: Normocephalic and atraumatic.  Cardiovascular:     Rate and Rhythm: Normal rate.  Pulmonary:     Effort: Pulmonary effort is normal. No respiratory distress.  Abdominal:     General: There is no distension.     Palpations: There is no mass.     Tenderness: There is no abdominal tenderness. There is no guarding or rebound.     Hernia: No hernia is present.  Genitourinary:    Comments: External: no lesions or erythema Vagina: rugated, pink, moist, scant brown bloody discharge  Musculoskeletal:        General: Normal range of motion.  Skin:    General: Skin is warm and dry.  Neurological:     General: No focal deficit present.     Mental Status: She is alert and oriented to person, place, and time.  Psychiatric:        Mood and Affect: Mood normal.    Results for orders placed or performed during the hospital encounter of 08/19/20 (from the past 24 hour(s))  Pregnancy, urine POC     Status: Abnormal   Collection Time: 08/19/20  9:36 AM  Result Value Ref Range   Preg Test, Ur POSITIVE (A) NEGATIVE  ABO/Rh     Status: None   Collection Time: 08/19/20 10:04 AM  Result Value Ref Range   ABO/RH(D) O POS    No rh immune globuloin      NOT A RH IMMUNE GLOBULIN CANDIDATE, PT RH POSITIVE Performed at [facility], [address] [place], [place] [address]   CBC     Status: Abnormal   Collection Time: 08/19/20 10:04 AM  Result Value Ref Range   WBC 4.3 4.0 - 10.5 K/uL   RBC 4.15 3.87 - 5.11 MIL/uL   Hemoglobin 12.2 12.0 - 15.0  g/dL   HCT 60.4 (L) 36 - 46 %   MCV 86.3 80.0 - 100.0 fL   MCH 29.4 26.0 - 34.0 pg   MCHC 34.1 30.0 - 36.0 g/dL   RDW 54.0 98.1 - 19.1 %   Platelets 243 150 - 400 K/uL   nRBC 0.0 0.0 - 0.2 %  hCG, quantitative, pregnancy     Status: Abnormal   Collection Time: 08/19/20 10:04 AM  Result Value Ref Range   hCG, Beta Chain, Quant, S 120,996 (H) <5 mIU/mL  Urinalysis, Routine w reflex microscopic     Status: Abnormal   Collection Time: 08/19/20 10:20 AM  Result Value Ref Range   Color, Urine YELLOW YELLOW   APPearance CLEAR CLEAR   Specific Gravity, Urine 1.015 1.005 - 1.030   pH 7.5 5.0 - 8.0   Glucose, UA NEGATIVE NEGATIVE mg/dL   Hgb urine dipstick SMALL (A) NEGATIVE   Bilirubin Urine NEGATIVE NEGATIVE   Ketones, ur NEGATIVE NEGATIVE mg/dL   Protein, ur NEGATIVE NEGATIVE mg/dL   Nitrite NEGATIVE NEGATIVE   Leukocytes,Ua NEGATIVE NEGATIVE  Urinalysis, Microscopic (reflex)     Status: Abnormal   Collection Time: 08/19/20 10:20 AM  Result Value Ref Range   RBC / HPF 0-5 0 - 5 RBC/hpf   WBC, UA NONE SEEN 0 - 5 WBC/hpf   Bacteria, UA RARE (A) NONE SEEN   Squamous Epithelial / LPF 0-5 0 - 5   US OB LESS THAN 14 WEEKS WITH OB TRANSVAGINAL  Result Date: 08/19/2020 CLINICAL DATA:  Vaginal bleeding. Estimated gestational age by last menstrual period equals 7 weeks 2 days. EXAM: OBSTETRIC <14 WK [place] AND TRANSVAGINAL OB US TECHNIQUE: Both transabdominal and transvaginal ultrasound examinations were performed for complete evaluation of the gestation as well as the maternal uterus, adnexal regions, and pelvic cul-de-sac. Transvaginal technique was performed to assess early pregnancy. COMPARISON:  None. FINDINGS: Intrauterine gestational sac: Present Yolk sac:  Present Embryo:  Present Cardiac Activity: Present Heart Rate: 154 bpm CRL:  11.7 mm   7 w   2 d                  [place] EDC: 04/05/2021 Subchorionic hemorrhage:  None  visualized. Maternal uterus/adnexae: Normal uterus and ovaries. Corpus luteal  cyst in the LEFT ovary. IMPRESSION: 1. Single intrauterine gestation with embryo and normal cardiac activity. 2. Estimated gestational age by crown rump length equals 7 weeks 2 days. Electronically Signed   By: Genevive Bi M.D.   On: 08/19/2020 11:45   MAU Course  Procedures  MDM Labs and [place] ordered and reviewed. Viable IUP on [place] and no SCH. Informed of results and no clear explanation for pain and bleeding. Stable for discharge home.   Assessment and Plan   1. [redacted] weeks gestation of pregnancy   2. Vaginal bleeding in pregnancy   3. Blood type, Rh positive    Discharge home Follow [facility] at [facility] as scheduled Pelvic rest SAB precautions  Allergies as of 08/19/2020      Reactions   Penicillins    unknown      Medication List    STOP taking these medications   amphetamine-dextroamphetamine 10 MG tablet Commonly known as: Adderall   etonogestrel 68 MG Impl implant Commonly known as: NEXPLANON     TAKE these medications   ferrous sulfate 325 (65 FE) MG tablet Take 325 mg by mouth daily with breakfast.   prenatal vitamin w/FE, FA 29-1 MG Chew chewable tablet Chew 1 tablet by mouth daily at 12 noon.      Donette Larry, CNM 08/19/2020, 12:24 PM

## 2020-08-20 LAB — GC/CHLAMYDIA PROBE AMP (~~LOC~~) NOT AT ARMC
Chlamydia: NEGATIVE
Comment: NEGATIVE
Comment: NORMAL
Neisseria Gonorrhea: NEGATIVE

## 2020-09-17 LAB — OB RESULTS CONSOLE RUBELLA ANTIBODY, IGM: Rubella: IMMUNE

## 2020-09-17 LAB — OB RESULTS CONSOLE HIV ANTIBODY (ROUTINE TESTING): HIV: NONREACTIVE

## 2020-09-17 LAB — OB RESULTS CONSOLE GC/CHLAMYDIA
Chlamydia: NEGATIVE
Gonorrhea: NEGATIVE

## 2020-09-17 LAB — OB RESULTS CONSOLE HEPATITIS B SURFACE ANTIGEN: Hepatitis B Surface Ag: NEGATIVE

## 2020-09-25 LAB — OB RESULTS CONSOLE GC/CHLAMYDIA
Chlamydia: NEGATIVE
Gonorrhea: NEGATIVE

## 2020-11-01 LAB — OB RESULTS CONSOLE ABO/RH: RH Type: POSITIVE

## 2020-11-01 LAB — OB RESULTS CONSOLE HEPATITIS B SURFACE ANTIGEN: Hepatitis B Surface Ag: NEGATIVE

## 2020-11-01 LAB — OB RESULTS CONSOLE RPR: RPR: NONREACTIVE

## 2020-11-01 LAB — OB RESULTS CONSOLE HIV ANTIBODY (ROUTINE TESTING): HIV: NONREACTIVE

## 2020-11-01 LAB — OB RESULTS CONSOLE RUBELLA ANTIBODY, IGM: Rubella: IMMUNE

## 2020-12-08 NOTE — L&D Delivery Note (Addendum)
Delivery Note:   G2P0010 at [redacted]w[redacted]d  Admitting diagnosis: Normal labor [O80, Z37.9] Risks: IUGR, and Preterm labor at [redacted]w[redacted]d. Onset of labor: 03/13/21 @2212  IOL/Augmentation: AROM and Pitocin ROM: AROM on 03/13/21 @ 2212, moderate amount of clear fluid noted.    Complete dilation at 03/14/2021  0428 Onset of pushing at 0442 FHR second stage Cat II. Moderate variability, with recurrent variables during contractions that return to baseline quickly. Accels present between contractions.     Analgesia 05/14/2021 intrapartum:None   NICU paged for delivery standby. Attendant at birth.  Fetal station at 0/+1 at onset of pushing. Sub-optimal maternal effort. Frequent position changes during pushing encouraged by SNM and [facility]. Pt getting discouraged. Mirror implemented for visual support. Fetal station +3 with good maternal effort. Pushing in litotomy position with SNM, CNM and L&D staff support, doula and husband [facility] present for birth and supportive. NSVD of female infant.     Delivery of a Live born female named "Noa Rose"  Birth Weight: pending** APGAR: 8, 9  Newborn Delivery   Birth date/time: 03/14/2021 05:38:00 Delivery type: Vaginal, Spontaneous      NSVD of female infant in cephalic presentation, DOA position restituted to LOA. Head and shoulders delivered with ease by SNM. FOB assisted with delivery of body. Vigorously crying infant held in father's hand for full minute d/t short cord. Cord double clamped by SNM and cut by FOB 05/14/2021. Infant placed immediately S2S.   APGAR:1 min-8 , 5 min-9  10 min-  Nuchal Cord: No  Collection of cord blood for typing completed. Cord blood donation-Short  Arterial cord blood sample-No    Placenta: Partial separation observed. Intact placenta expelled with gentle cord traction, LUS massage and maternal effort; 3 vessel cord.  Uterotonics: IV pitocin bolus initiated  Placenta to pathology. Uterine tone firm, midline, below umbilicus with  minimal bleeding.   2nd degree  laceration identified Episiotomy:None  Local analgesia: 1% Lidocaine.   Repair: Tissues well approximated with 2.0 vicryl in traditional fashion.   Est. Blood Loss (mL):100.00   Complications: None   Mom to postpartum.  Baby to Couplet care / Skin to Skin.  Delivery Report:  Review the Delivery Report for details.     Signed: Shantonette Christiane Ha) Danella Deis, BSN, RNC-OB  Student Nurse-Midwife   03/14/2021  6:54 AM

## 2021-01-08 DIAGNOSIS — Z3480 Encounter for supervision of other normal pregnancy, unspecified trimester: Secondary | ICD-10-CM | POA: Diagnosis not present

## 2021-01-16 DIAGNOSIS — O26873 Cervical shortening, third trimester: Secondary | ICD-10-CM | POA: Diagnosis not present

## 2021-01-16 DIAGNOSIS — Z3689 Encounter for other specified antenatal screening: Secondary | ICD-10-CM | POA: Diagnosis not present

## 2021-01-16 DIAGNOSIS — Z3A28 28 weeks gestation of pregnancy: Secondary | ICD-10-CM | POA: Diagnosis not present

## 2021-01-25 DIAGNOSIS — O26873 Cervical shortening, third trimester: Secondary | ICD-10-CM | POA: Diagnosis not present

## 2021-01-25 DIAGNOSIS — O43122 Velamentous insertion of umbilical cord, second trimester: Secondary | ICD-10-CM | POA: Diagnosis not present

## 2021-01-25 DIAGNOSIS — Z23 Encounter for immunization: Secondary | ICD-10-CM | POA: Diagnosis not present

## 2021-01-25 DIAGNOSIS — O3693X1 Maternal care for fetal problem, unspecified, third trimester, fetus 1: Secondary | ICD-10-CM | POA: Diagnosis not present

## 2021-01-25 DIAGNOSIS — Z3A30 30 weeks gestation of pregnancy: Secondary | ICD-10-CM | POA: Diagnosis not present

## 2021-01-29 DIAGNOSIS — Z3A30 30 weeks gestation of pregnancy: Secondary | ICD-10-CM | POA: Diagnosis not present

## 2021-01-29 DIAGNOSIS — O26873 Cervical shortening, third trimester: Secondary | ICD-10-CM | POA: Diagnosis not present

## 2021-01-29 DIAGNOSIS — O365999 Maternal care for other known or suspected poor fetal growth, unspecified trimester, other fetus: Secondary | ICD-10-CM | POA: Diagnosis not present

## 2021-02-01 DIAGNOSIS — O365930 Maternal care for other known or suspected poor fetal growth, third trimester, not applicable or unspecified: Secondary | ICD-10-CM | POA: Diagnosis not present

## 2021-02-01 DIAGNOSIS — O26873 Cervical shortening, third trimester: Secondary | ICD-10-CM | POA: Diagnosis not present

## 2021-02-01 DIAGNOSIS — Z3A31 31 weeks gestation of pregnancy: Secondary | ICD-10-CM | POA: Diagnosis not present

## 2021-02-04 DIAGNOSIS — O43123 Velamentous insertion of umbilical cord, third trimester: Secondary | ICD-10-CM | POA: Diagnosis not present

## 2021-02-04 DIAGNOSIS — N898 Other specified noninflammatory disorders of vagina: Secondary | ICD-10-CM | POA: Diagnosis not present

## 2021-02-04 DIAGNOSIS — Z3A31 31 weeks gestation of pregnancy: Secondary | ICD-10-CM | POA: Diagnosis not present

## 2021-02-04 DIAGNOSIS — O26873 Cervical shortening, third trimester: Secondary | ICD-10-CM | POA: Diagnosis not present

## 2021-02-07 DIAGNOSIS — O26873 Cervical shortening, third trimester: Secondary | ICD-10-CM | POA: Diagnosis not present

## 2021-02-07 DIAGNOSIS — Z3A31 31 weeks gestation of pregnancy: Secondary | ICD-10-CM | POA: Diagnosis not present

## 2021-02-07 DIAGNOSIS — O43123 Velamentous insertion of umbilical cord, third trimester: Secondary | ICD-10-CM | POA: Diagnosis not present

## 2021-02-12 DIAGNOSIS — Z3A32 32 weeks gestation of pregnancy: Secondary | ICD-10-CM | POA: Diagnosis not present

## 2021-02-12 DIAGNOSIS — O365930 Maternal care for other known or suspected poor fetal growth, third trimester, not applicable or unspecified: Secondary | ICD-10-CM | POA: Diagnosis not present

## 2021-02-12 DIAGNOSIS — O26873 Cervical shortening, third trimester: Secondary | ICD-10-CM | POA: Diagnosis not present

## 2021-02-15 DIAGNOSIS — O26873 Cervical shortening, third trimester: Secondary | ICD-10-CM | POA: Diagnosis not present

## 2021-02-15 DIAGNOSIS — O3693X1 Maternal care for fetal problem, unspecified, third trimester, fetus 1: Secondary | ICD-10-CM | POA: Diagnosis not present

## 2021-02-15 DIAGNOSIS — Z3A32 32 weeks gestation of pregnancy: Secondary | ICD-10-CM | POA: Diagnosis not present

## 2021-02-18 DIAGNOSIS — O365930 Maternal care for other known or suspected poor fetal growth, third trimester, not applicable or unspecified: Secondary | ICD-10-CM | POA: Diagnosis not present

## 2021-02-18 DIAGNOSIS — Z3A33 33 weeks gestation of pregnancy: Secondary | ICD-10-CM | POA: Diagnosis not present

## 2021-02-18 DIAGNOSIS — O26873 Cervical shortening, third trimester: Secondary | ICD-10-CM | POA: Diagnosis not present

## 2021-02-21 DIAGNOSIS — O43123 Velamentous insertion of umbilical cord, third trimester: Secondary | ICD-10-CM | POA: Diagnosis not present

## 2021-02-21 DIAGNOSIS — Z3A33 33 weeks gestation of pregnancy: Secondary | ICD-10-CM | POA: Diagnosis not present

## 2021-02-21 DIAGNOSIS — O26873 Cervical shortening, third trimester: Secondary | ICD-10-CM | POA: Diagnosis not present

## 2021-02-26 DIAGNOSIS — O365930 Maternal care for other known or suspected poor fetal growth, third trimester, not applicable or unspecified: Secondary | ICD-10-CM | POA: Diagnosis not present

## 2021-02-26 DIAGNOSIS — O26873 Cervical shortening, third trimester: Secondary | ICD-10-CM | POA: Diagnosis not present

## 2021-02-26 DIAGNOSIS — Z3A34 34 weeks gestation of pregnancy: Secondary | ICD-10-CM | POA: Diagnosis not present

## 2021-03-01 DIAGNOSIS — O365930 Maternal care for other known or suspected poor fetal growth, third trimester, not applicable or unspecified: Secondary | ICD-10-CM | POA: Diagnosis not present

## 2021-03-01 DIAGNOSIS — O26873 Cervical shortening, third trimester: Secondary | ICD-10-CM | POA: Diagnosis not present

## 2021-03-01 DIAGNOSIS — Z3A35 35 weeks gestation of pregnancy: Secondary | ICD-10-CM | POA: Diagnosis not present

## 2021-03-01 DIAGNOSIS — Z3685 Encounter for antenatal screening for Streptococcus B: Secondary | ICD-10-CM | POA: Diagnosis not present

## 2021-03-01 LAB — OB RESULTS CONSOLE GBS: GBS: NEGATIVE

## 2021-03-04 DIAGNOSIS — Z3A35 35 weeks gestation of pregnancy: Secondary | ICD-10-CM | POA: Diagnosis not present

## 2021-03-04 DIAGNOSIS — O26873 Cervical shortening, third trimester: Secondary | ICD-10-CM | POA: Diagnosis not present

## 2021-03-04 DIAGNOSIS — O365930 Maternal care for other known or suspected poor fetal growth, third trimester, not applicable or unspecified: Secondary | ICD-10-CM | POA: Diagnosis not present

## 2021-03-05 DIAGNOSIS — Z3483 Encounter for supervision of other normal pregnancy, third trimester: Secondary | ICD-10-CM | POA: Diagnosis not present

## 2021-03-05 DIAGNOSIS — Z3482 Encounter for supervision of other normal pregnancy, second trimester: Secondary | ICD-10-CM | POA: Diagnosis not present

## 2021-03-07 DIAGNOSIS — O26873 Cervical shortening, third trimester: Secondary | ICD-10-CM | POA: Diagnosis not present

## 2021-03-07 DIAGNOSIS — Z3A35 35 weeks gestation of pregnancy: Secondary | ICD-10-CM | POA: Diagnosis not present

## 2021-03-07 DIAGNOSIS — O365930 Maternal care for other known or suspected poor fetal growth, third trimester, not applicable or unspecified: Secondary | ICD-10-CM | POA: Diagnosis not present

## 2021-03-08 DIAGNOSIS — Z3400 Encounter for supervision of normal first pregnancy, unspecified trimester: Secondary | ICD-10-CM | POA: Diagnosis not present

## 2021-03-11 DIAGNOSIS — O365930 Maternal care for other known or suspected poor fetal growth, third trimester, not applicable or unspecified: Secondary | ICD-10-CM | POA: Diagnosis not present

## 2021-03-11 DIAGNOSIS — O26873 Cervical shortening, third trimester: Secondary | ICD-10-CM | POA: Diagnosis not present

## 2021-03-11 DIAGNOSIS — Z3A36 36 weeks gestation of pregnancy: Secondary | ICD-10-CM | POA: Diagnosis not present

## 2021-03-12 ENCOUNTER — Encounter (HOSPITAL_COMMUNITY): Payer: Self-pay | Admitting: Obstetrics and Gynecology

## 2021-03-12 ENCOUNTER — Other Ambulatory Visit: Payer: Self-pay

## 2021-03-12 ENCOUNTER — Inpatient Hospital Stay (HOSPITAL_COMMUNITY)
Admission: AD | Admit: 2021-03-12 | Discharge: 2021-03-16 | DRG: 805 | Disposition: A | Discharge status: Home / Self Care | Payer: 59 | Attending: Obstetrics and Gynecology | Admitting: Obstetrics and Gynecology

## 2021-03-12 DIAGNOSIS — Z3A36 36 weeks gestation of pregnancy: Secondary | ICD-10-CM

## 2021-03-12 DIAGNOSIS — Z20822 Contact with and (suspected) exposure to covid-19: Secondary | ICD-10-CM | POA: Diagnosis present

## 2021-03-12 DIAGNOSIS — O365930 Maternal care for other known or suspected poor fetal growth, third trimester, not applicable or unspecified: Principal | ICD-10-CM | POA: Diagnosis present

## 2021-03-12 DIAGNOSIS — O365990 Maternal care for other known or suspected poor fetal growth, unspecified trimester, not applicable or unspecified: Secondary | ICD-10-CM

## 2021-03-12 DIAGNOSIS — F419 Anxiety disorder, unspecified: Secondary | ICD-10-CM | POA: Diagnosis present

## 2021-03-12 HISTORY — DX: Attention-deficit hyperactivity disorder, unspecified type: F90.9

## 2021-03-12 NOTE — MAU Note (Signed)
Pt reports contractions q . Back pain. Denies bleeding. Reports good fetal movement.

## 2021-03-13 ENCOUNTER — Inpatient Hospital Stay (HOSPITAL_BASED_OUTPATIENT_CLINIC_OR_DEPARTMENT_OTHER): Payer: 59

## 2021-03-13 DIAGNOSIS — O368930 Maternal care for other specified fetal problems, third trimester, not applicable or unspecified: Secondary | ICD-10-CM

## 2021-03-13 DIAGNOSIS — O368330 Maternal care for abnormalities of the fetal heart rate or rhythm, third trimester, not applicable or unspecified: Secondary | ICD-10-CM

## 2021-03-13 DIAGNOSIS — Z3A36 36 weeks gestation of pregnancy: Secondary | ICD-10-CM

## 2021-03-13 DIAGNOSIS — O4703 False labor before 37 completed weeks of gestation, third trimester: Secondary | ICD-10-CM

## 2021-03-13 DIAGNOSIS — O365930 Maternal care for other known or suspected poor fetal growth, third trimester, not applicable or unspecified: Secondary | ICD-10-CM | POA: Diagnosis not present

## 2021-03-13 DIAGNOSIS — O289 Unspecified abnormal findings on antenatal screening of mother: Secondary | ICD-10-CM

## 2021-03-13 DIAGNOSIS — Z20822 Contact with and (suspected) exposure to covid-19: Secondary | ICD-10-CM | POA: Diagnosis not present

## 2021-03-13 LAB — RPR: RPR Ser Ql: NONREACTIVE

## 2021-03-13 LAB — CBC
HCT: 41 % (ref 36.0–46.0)
Hemoglobin: 13.9 g/dL (ref 12.0–15.0)
MCH: 29.2 pg (ref 26.0–34.0)
MCHC: 33.9 g/dL (ref 30.0–36.0)
MCV: 86.1 fL (ref 80.0–100.0)
Platelets: 202 10*3/uL (ref 150–400)
RBC: 4.76 MIL/uL (ref 3.87–5.11)
RDW: 12.9 % (ref 11.5–15.5)
WBC: 8.9 10*3/uL (ref 4.0–10.5)
nRBC: 0 % (ref 0.0–0.2)

## 2021-03-13 LAB — TYPE AND SCREEN
ABO/RH(D): O POS
Antibody Screen: NEGATIVE

## 2021-03-13 LAB — RESP PANEL BY RT-PCR (FLU A&B, COVID) ARPGX2
Influenza A by PCR: NEGATIVE
Influenza B by PCR: NEGATIVE
SARS Coronavirus 2 by RT PCR: NEGATIVE

## 2021-03-13 MED ORDER — SOD CITRATE-CITRIC ACID 500-334 MG/5ML PO SOLN: 30.0000 mL | ORAL | Status: DC | PRN

## 2021-03-13 MED ORDER — OXYTOCIN-SODIUM CHLORIDE 30-0.9 UT/500ML-% IV SOLN
1.0000 m[IU]/min | INTRAVENOUS | Status: DC
  Administered 2021-03-13: 2 m[IU]/min via INTRAVENOUS
  Filled 2021-03-13 (×2): qty 500

## 2021-03-13 MED ORDER — OXYCODONE-ACETAMINOPHEN 5-325 MG PO TABS: 2.0000 | ORAL_TABLET | ORAL | Status: DC | PRN

## 2021-03-13 MED ORDER — OXYCODONE-ACETAMINOPHEN 5-325 MG PO TABS: 1.0000 | ORAL_TABLET | ORAL | Status: DC | PRN

## 2021-03-13 MED ORDER — FLEET ENEMA 7-19 GM/118ML RE ENEM: 1.0000 | ENEMA | RECTAL | Status: DC | PRN

## 2021-03-13 MED ORDER — LACTATED RINGERS IV SOLN: INTRAVENOUS | Status: DC

## 2021-03-13 MED ORDER — OXYTOCIN BOLUS FROM INFUSION
333.0000 mL | Freq: Once | INTRAVENOUS | Status: AC
  Administered 2021-03-14: 333 mL via INTRAVENOUS

## 2021-03-13 MED ORDER — ONDANSETRON HCL 4 MG/2ML IJ SOLN: 4.0000 mg | Freq: Four times a day (QID) | INTRAMUSCULAR | Status: DC | PRN

## 2021-03-13 MED ORDER — LACTATED RINGERS IV SOLN
500.0000 mL | INTRAVENOUS | Status: DC | PRN
  Administered 2021-03-13: 500 mL via INTRAVENOUS

## 2021-03-13 MED ORDER — ACETAMINOPHEN 325 MG PO TABS: 650.0000 mg | ORAL_TABLET | ORAL | Status: DC | PRN

## 2021-03-13 MED ORDER — OXYTOCIN-SODIUM CHLORIDE 30-0.9 UT/500ML-% IV SOLN
2.5000 [IU]/h | INTRAVENOUS | Status: DC
  Administered 2021-03-14: 2.5 [IU]/h via INTRAVENOUS

## 2021-03-13 MED ORDER — LIDOCAINE HCL (PF) 1 % IJ SOLN
30.0000 mL | INTRAMUSCULAR | Status: DC | PRN
  Administered 2021-03-14: 30 mL via SUBCUTANEOUS
  Filled 2021-03-13: qty 30

## 2021-03-13 MED ORDER — ZOLPIDEM TARTRATE 5 MG PO TABS
5.0000 mg | ORAL_TABLET | Freq: Every evening | ORAL | Status: DC | PRN
  Administered 2021-03-13: 5 mg via ORAL
  Filled 2021-03-13: qty 1

## 2021-03-13 MED ORDER — TERBUTALINE SULFATE 1 MG/ML IJ SOLN: 0.2500 mg | Freq: Once | INTRAMUSCULAR | Status: DC | PRN

## 2021-03-13 NOTE — Progress Notes (Addendum)
S: Misty Ayers not feeling contractions. Has been able to eat lunch and rest throughout the day. Reports good FM, but no change in contraction intensity or frequency.   O: Vitals:   03/13/21 0932 03/13/21 1136 03/13/21 1515 03/13/21 1623  BP: 109/75 117/72 125/80 123/66  Pulse: 95 (!) 103 93 90  Resp:    15  Temp: 98.6 F (37 C)  98 F (36.7 C) 98.4 F (36.9 C)  TempSrc: Oral  Oral Axillary  SpO2:      Weight:      Height:         FHT:  FHR: 140 bpm, variability: moderate, accelerations: Present, occasional variables, with quick return to baseline. Periodic runs of fetal tachycardia with minimal variability up to 170's/180's bpm lasting between 20-30 mins. Not responding to IV fluid bolus, or position changes.   UC:   Occasional and mild to palpation. SVE:   Dilation: 4 Effacement (%): 70 Station: -2 Exam by:: Misty Ayers, CNM   A / P: Protracted latent phase of labor, combined with preterm contractions.   Labor: Due to premature fetus compromised by IUGR, and Cat II FHT, MFM recommends to proceed with augmentation of labor.  -Education provided with Misty Ayers and family on the importance of delivery for fetal well-being at this time. Discussed the r/b of BMZ injection for lung maturity at [redacted]w[redacted]d. Pt deferred BMZ d/t <36hrs from [redacted]w[redacted]d.   -Also discussed the likelihood of NICU stay and respiratory support of infant at delivery. Consult to NICU  -Misty Ayers and family agreeable to plan, no questions at this time.   -Membrane sweep performed during SVE. Augmentation initiated with IV pitocin 2x2.   -Encouraged frequent movement and position changes for fetal position in labor. Fetal Wellbeing:  Category I and Category II. Monitor continuously for worsening sx of distress.  Pain Control:  Labor support without medications. Hydrotherapy offered. Pt may have epidural upon request. Anticipated MOD: cautious for NSVD; IUGR & possible fetal intolerance of labor.   Misty Ayers) Misty Ayers,  BSN, RNC-OB  Student Nurse-Midwife   03/13/2021  5:48 PM

## 2021-03-13 NOTE — Progress Notes (Signed)
U/S paged for ETA.

## 2021-03-13 NOTE — Progress Notes (Addendum)
Patient ID: Misty Ayers, female   DOB: 05/28/1991, 28 y.o.   MRN: [id number] Pt assessed. CNM following. Presented in preterm labor at 36.5 wks, know IUGR at 7% growth, noted few late decels during monitoring, MFM consulted due to preterm if late decels indicated delivery, labor augmentation was advised, pt declined BTMZ dose after counseling. CNM attempted membrane sweep and is now on pitocin at 14 mu with minimal progress. FHT has been in cat I 130s/ + accels/ no decel/ mod variability Plan AROM and reassess if able to enter active labor and assess fetal tolerance. Small chance of C-section for fetal intolerance d/w pt. No concerns at present  --Shea Evans  MD

## 2021-03-13 NOTE — MAU Provider Note (Signed)
S: Ms. Misty Ayers is a 31 y.o. G2P0010 at [redacted]w[redacted]d  who presents to MAU today for labor evaluation.  Nurse reports that after 2 hours patient with minimal surgical change.  Nurse also reports fetal tracing now reflective of tachycardia.  Provider reviews strip.   Cervical exam by RN:  Dilation: 3.5 Effacement (%): 70 Station: -2 Presentation: Vertex Exam by:: Manuela Neptune, RN  Fetal Monitoring: Baseline: 195 Variability: Moderate Accelerations: Present Decelerations: Variables Contractions: Q2-34min  MDM Discussed patient with RN. NST reviewed.   A: SIUP at [redacted]w[redacted]d  False labor Fetal Tachycardia  P: Nurse instructed to perform position change and monitor. Will reassess in one hour.   Reassessment (0106)  -NST with notable variables and lates. -Will send for BPP to reassure. -Nurse instructed to inform patient of POC. -Patient reports she has been managed for SGA and MCI. -Provider, D. Renae Fickle, CNM, calls and requests update.  Nurse unavailable and provider informs of POC. -Requests that patient be informed of need for f/u in office tomorrow if she is discharged. -Provider informs her that she will call back if bpp is not satisfactory.   Reassessment ( 1:54 AM)  -BPP returns 6/8, No breathing -D. Renae Fickle, CNM contacted and informed of results. *States she will admit patient. *Nurse instructed to place admission orders. -Patient informed of POC.   -Questions and concerns addressed. -Congratulations given.  Cherre Robins MSN, CNM Advanced Practice Provider, [facility] [employer] [facility]

## 2021-03-13 NOTE — Consult Note (Signed)
MFM Consult Note  Misty Ayers is a 30 y.o. G2P0010 at [redacted]w[redacted]d.  Her pregnancy has been complicated by IUGR.  She has been followed with serial growth ultrasounds in her OB/GYN's office and twice weekly fetal testing.   Her most recent growth ultrasound showed an EFW that was at the 7th percentile for her gestational age.  Doppler studies of the umbilical arteries showed a normal S/D ratio.  She presented to the MAU early this morning with frequent contractions and was found to be in early labor.  On presentation to the MAU, variable decelerations and possible late decelerations were noted.  She had a biophysical profile that was 6 out of 8 (-2 for fetal breathing movements that did not meet criteria).  Her cervix then progressed to 3 to 4 cm dilated.  She was monitored overnight where fetal tachycardia with a heart rate in the 180s and occasional variable decelerations were noted.  Her fetal heart rate tracing is currently reactive.  Due to an IUGR fetus with questionable fetal status, at her current gestational age, I would recommend delivery.  Although a course of antenatal corticosteroids is recommended at her current gestational age, the patient has declined to receive the steroids.
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
## 2021-03-13 NOTE — Progress Notes (Signed)
Misty Ayers is a 30 y.o. G2P0010 at [redacted]w[redacted]d by LMP admitted for preterm contractions.  Subjective:  Slept some with Ambien during the night, this morning her contractions have eased off and spaced out. Reports good FM with fetal "hickups", no LOF or VB.  Has been resting in bed mostly, has breakfast, denies N/V.   Objective: Vitals:   03/13/21 0449 03/13/21 0553 03/13/21 0932 03/13/21 1136  BP: 107/61 114/75 109/75 117/72  Pulse: 66 71 95 (!) 103  Resp: 18 16    Temp:   98.6 F (37 C)   TempSrc:   Oral   SpO2:      Weight:      Height:        FHT:  150 BPM, minimal to moderate variability, small accels, occasional variables with late return to baseline, one episode of tachy repeated this AM x 40 min up to 180 BPM. UC:  Irregular, mild SVE:  Deferred  Labs:   Recent Labs    03/13/21 0204  WBC 8.9  HGB 13.9  HCT 41.0  PLT 202    Assessment / Plan: G2P0010 30 y.o. [redacted]w[redacted]d Protracted latent phase, preterm contractions  Labor: deferred vaginal exam as patient is comfortable with mild ctx anddoes not appear in active labor  Given prematurity and labor stalling with Category 2 fetal surveillance, will consult MFM for recommendation wether to continue observation vs proceed w/ augmentation. Will repeat BPP and UAD today pending MFM consult.  Preeclampsia:  no signs or symptoms of toxicity Fetal Wellbeing:  Category I and Category II, intermittent fetal tachy and occasional late return to baseline Pain Control:  plans unmedicated birth I/D:  GBS neg Anticipated MOD:  cautious for NSVB   POC in consult w/ Dr. Conni Ayers.   Misty Ayers, CNM, MSN 03/13/2021, 12:14 PM

## 2021-03-13 NOTE — Progress Notes (Signed)
S: Resting in bed in lateral position. Contractions variable in intensity but mostly comfortable. Spouse Christiane Ha present for support.  Discussed AROM augmentation, R/B of procedure reviewed and patient agrees.   O: Vitals:   03/13/21 1925 03/13/21 2021 03/13/21 2108 03/13/21 2141  BP: 114/76 115/78 111/73 123/79  Pulse: 91 80 85 80  Resp:    16  Temp:  98.7 F (37.1 C)    TempSrc:  Oral    SpO2:      Weight:      Height:         FHT:  FHR: 150 bpm, variability: moderate,  accelerations:  Present,  decelerations:  Present occasional small variables. Difficulty maintaining continuous EFM d/t mother's movement.  UC:   irregular, every 1.5-4 minutes SVE:   Dilation: 4.5 Effacement (%): 80 Station: -2 Exam by:: Arlan Organ CNM AROM clear fluid, moderate amount  A / P: Protracted latent phase  IUGR with preterm contractions Pitocin augmentation ongoing with minimal response for past few hours AROM performed  GBS neg Fetal Wellbeing:  Category I Pain Control:  Labor support without medications  Anticipated MOD:  NSVD, monitor closely for FITL Dr. Juliene Pina consulted for POC.   [name], CNM, MSN 03/13/2021, 10:28 PM

## 2021-03-14 ENCOUNTER — Encounter (HOSPITAL_COMMUNITY): Payer: Self-pay | Admitting: Obstetrics and Gynecology

## 2021-03-14 DIAGNOSIS — O365930 Maternal care for other known or suspected poor fetal growth, third trimester, not applicable or unspecified: Secondary | ICD-10-CM | POA: Diagnosis not present

## 2021-03-14 DIAGNOSIS — O365990 Maternal care for other known or suspected poor fetal growth, unspecified trimester, not applicable or unspecified: Secondary | ICD-10-CM | POA: Diagnosis not present

## 2021-03-14 DIAGNOSIS — Z3A36 36 weeks gestation of pregnancy: Secondary | ICD-10-CM | POA: Diagnosis not present

## 2021-03-14 MED ORDER — FLEET ENEMA 7-19 GM/118ML RE ENEM: 1.0000 | ENEMA | Freq: Every day | RECTAL | Status: DC | PRN

## 2021-03-14 MED ORDER — SENNOSIDES-DOCUSATE SODIUM 8.6-50 MG PO TABS
2.0000 | ORAL_TABLET | ORAL | Status: DC
  Administered 2021-03-14 – 2021-03-16 (×3): 2 via ORAL
  Filled 2021-03-14 (×3): qty 2

## 2021-03-14 MED ORDER — IBUPROFEN 600 MG PO TABS
600.0000 mg | ORAL_TABLET | Freq: Four times a day (QID) | ORAL | Status: DC
  Administered 2021-03-14 – 2021-03-16 (×9): 600 mg via ORAL
  Filled 2021-03-14 (×10): qty 1

## 2021-03-14 MED ORDER — BENZOCAINE-MENTHOL 20-0.5 % EX AERO
1.0000 "application " | INHALATION_SPRAY | CUTANEOUS | Status: DC | PRN
  Administered 2021-03-14: 1 via TOPICAL
  Filled 2021-03-14: qty 56

## 2021-03-14 MED ORDER — ONDANSETRON HCL 4 MG/2ML IJ SOLN: 4.0000 mg | INTRAMUSCULAR | Status: DC | PRN

## 2021-03-14 MED ORDER — ZOLPIDEM TARTRATE 5 MG PO TABS: 5.0000 mg | ORAL_TABLET | Freq: Every evening | ORAL | Status: DC | PRN

## 2021-03-14 MED ORDER — WITCH HAZEL-GLYCERIN EX PADS
1.0000 | MEDICATED_PAD | CUTANEOUS | Status: DC | PRN
Start: 2021-03-14 — End: 2021-03-16
  Administered 2021-03-14: 1 via TOPICAL

## 2021-03-14 MED ORDER — TETANUS-DIPHTH-ACELL PERTUSSIS 5-2.5-18.5 LF-MCG/0.5 IM SUSY: 0.5000 mL | PREFILLED_SYRINGE | Freq: Once | INTRAMUSCULAR | Status: DC

## 2021-03-14 MED ORDER — BISACODYL 10 MG RE SUPP: 10.0000 mg | Freq: Every day | RECTAL | Status: DC | PRN

## 2021-03-14 MED ORDER — DIBUCAINE (PERIANAL) 1 % EX OINT
1.0000 "application " | TOPICAL_OINTMENT | CUTANEOUS | Status: DC | PRN
  Administered 2021-03-14 – 2021-03-15 (×2): 1 via RECTAL
  Filled 2021-03-14 (×2): qty 28

## 2021-03-14 MED ORDER — DIPHENHYDRAMINE HCL 25 MG PO CAPS: 25.0000 mg | ORAL_CAPSULE | Freq: Four times a day (QID) | ORAL | Status: DC | PRN

## 2021-03-14 MED ORDER — COCONUT OIL OIL
1.0000 "application " | TOPICAL_OIL | Status: DC | PRN
  Administered 2021-03-14: 1 via TOPICAL

## 2021-03-14 MED ORDER — FENTANYL CITRATE (PF) 100 MCG/2ML IJ SOLN
50.0000 ug | INTRAMUSCULAR | Status: DC | PRN
  Administered 2021-03-14 (×2): 100 ug via INTRAVENOUS
  Filled 2021-03-14 (×2): qty 2

## 2021-03-14 MED ORDER — ONDANSETRON HCL 4 MG PO TABS: 4.0000 mg | ORAL_TABLET | ORAL | Status: DC | PRN

## 2021-03-14 MED ORDER — SODIUM CHLORIDE 0.9 % IV SOLN
12.5000 mg | Freq: Four times a day (QID) | INTRAVENOUS | Status: DC | PRN
  Filled 2021-03-14 (×2): qty 0.5

## 2021-03-14 MED ORDER — PRENATAL MULTIVITAMIN CH
1.0000 | ORAL_TABLET | Freq: Every day | ORAL | Status: DC
  Administered 2021-03-15 – 2021-03-16 (×2): 1 via ORAL
  Filled 2021-03-14 (×3): qty 1

## 2021-03-14 MED ORDER — ACETAMINOPHEN 500 MG PO TABS
1000.0000 mg | ORAL_TABLET | Freq: Four times a day (QID) | ORAL | Status: DC
  Administered 2021-03-14 – 2021-03-16 (×8): 1000 mg via ORAL
  Filled 2021-03-14 (×9): qty 2

## 2021-03-14 MED ORDER — SODIUM CHLORIDE 0.9 % IV SOLN
12.5000 mg | Freq: Once | INTRAVENOUS | Status: DC
  Filled 2021-03-14: qty 0.5

## 2021-03-14 MED ORDER — SIMETHICONE 80 MG PO CHEW: 80.0000 mg | CHEWABLE_TABLET | ORAL | Status: DC | PRN

## 2021-03-14 NOTE — Lactation Note (Signed)
This note was copied from a baby's chart. Lactation Consultation Note Baby less than hr old at time of consult. Mom holding baby STS. LC attempted to latch baby baby rooted several times, very shallow latch. Baby doesn't open wide and is clamping down down mom. Baby off and on. Really only suckled a few times. Baby covered STS, feels cold. Informed RN. LC placed another blanket on baby.  Mom will be f/u on MBU. Briefly discussed a few LPI information w/mom since she kept saying how early the baby was. Baby is very small.  Patient Name: Girl Orvella Olberding [id number] Date: 03/14/2021 Reason for consult: L&D Initial assessment;Primapara;Late-preterm 34-36.6wks;Infant < 6lbs Age:29 hours  Maternal Data    Feeding    LATCH Score Latch: Too sleepy or reluctant, no latch achieved, no sucking elicited.  Audible Swallowing: None  Type of Nipple: Everted at rest and after stimulation  Comfort (Breast/Nipple): Soft / non-tender  Hold (Positioning): Full assist, staff holds infant at breast  LATCH Score: 4   Lactation Tools Discussed/Used    Interventions Interventions: Support pillows;Assisted with latch;Position options;Skin to skin;Adjust position;Breast compression  Discharge    Consult Status Consult Status: Follow-up Date: 03/14/21 Follow-up type: In-patient    Charyl Dancer 03/14/2021, 6:37 AM

## 2021-03-14 NOTE — Social Work (Signed)
MOB was referred for history of depression and anxiety.   * Referral screened out by Clinical Social Worker because none of the following criteria appear to apply:  ~ History of anxiety/depression during this pregnancy, or of post-partum depression following prior delivery. ~ Diagnosis of anxiety and/or depression within last 3 years. Per chart review, CSW notes a diagnosis date of 2016 or earlier. OR * MOB's symptoms currently being treated with medication and/or therapy.  Please contact the Clinical Social Worker if needs arise, by MOB request, or if MOB scores greater than 9/yes to question 10 on Edinburgh Postpartum Depression Screen.  Neela Zecca, LCSWA Clinical Social Work [facility]  [phone] 

## 2021-03-14 NOTE — Progress Notes (Signed)
Per pt RN and security, patient's doula is not a [facility] approved doula.  Per policy the doula will be the visitor for the patients stay at the hospital along with FOB

## 2021-03-14 NOTE — Lactation Note (Signed)
This note was copied from a baby's chart. Lactation Consultation Note  Patient Name: Misty Ayers Today's Date: 03/14/2021 Age:28 hours  Mom and baby sleeping upon visit. LC will come back to room at another time as possible.     Crixus Mcaulay A Higuera Ancidey 03/14/2021, 10:31 PM

## 2021-03-14 NOTE — Progress Notes (Signed)
S: Working with doula Macey, using HK with hip squeeze for comfort. Reports rectal pressure and has had BM earlier.  Had one dose IV fentanyl and phenergan after AROM for pain control. Also used hydrotherapy for pain control with good effect.   O: Vitals:   03/13/21 2330 03/14/21 0004 03/14/21 0054 03/14/21 0234  BP: 131/87 127/79 125/76 102/64  Pulse: 83 81 78 98  Resp:    16  Temp:  98.4 F (36.9 C)  98.5 F (36.9 C)  TempSrc:  Oral  Oral  SpO2:      Weight:      Height:         FHT:  FHR: 135 bpm, variability: moderate,  accelerations:  Present,  decelerations:  Present variable, early, episodic UC:   regular, every 2-3 minutes SVE:   Dilation: 7 Effacement (%): 90 Station: -1 Exam by:: Gavriel Holzhauer P, CNM Pitocin 18 mu/min  A / P: Labor augmentation IUGR, preterm labor  Pitocin titration at 18 mu/min Anticipate transition Repeat one dose Fentanyl now per request Continue with position changes for fetal descent  Fetal Wellbeing:  Category I Pain Control:  IV pain meds and doula support  Anticipated MOD:  NSVD  [name], CNM, MSN 03/14/2021, 3:35 AM

## 2021-03-15 DIAGNOSIS — F419 Anxiety disorder, unspecified: Secondary | ICD-10-CM | POA: Diagnosis present

## 2021-03-15 LAB — CBC
HCT: 34.3 % — ABNORMAL LOW (ref 36.0–46.0)
Hemoglobin: 11.6 g/dL — ABNORMAL LOW (ref 12.0–15.0)
MCH: 29.3 pg (ref 26.0–34.0)
MCHC: 33.8 g/dL (ref 30.0–36.0)
MCV: 86.6 fL (ref 80.0–100.0)
Platelets: 177 10*3/uL (ref 150–400)
RBC: 3.96 MIL/uL (ref 3.87–5.11)
RDW: 12.8 % (ref 11.5–15.5)
WBC: 11.6 10*3/uL — ABNORMAL HIGH (ref 4.0–10.5)
nRBC: 0 % (ref 0.0–0.2)

## 2021-03-15 LAB — SURGICAL PATHOLOGY

## 2021-03-15 NOTE — Lactation Note (Signed)
This note was copied from a baby's chart. Lactation Consultation Note  Patient Name: Misty Ayers [id number] Date: 03/15/2021 Reason for consult: Follow-up assessment;Mother's request;Primapara;1st time breastfeeding;Early term 37-38.6wks;Infant < 6lbs Age:28 hours   Infant cueing LC assisted with latching. Infant able to latch for 19 minutes on both sides in football with signs milk transfer. Mom had some compression stripes but no other signs of trauma.   Infant able to extend tongue pass the gum line. Mom shown to bring out lower lip with a chin tug.   Plan 1. To feed based on cues 8-12x in 24 hr period, no more than 3 hrs without an attempt. Mom to offer both breasts and look for swallows.  2. Dad to paced bottle feed based on LPTI guidelines using extra slow flow nipple offering EBM followed by DBM.  3. Mom to pump q 3 hrs for 15 minutes   Parents are aware to keep total feeding time under 30 minutes between nursing and supplementing.  All questions answered at the end of the visit.   Maternal Data Has patient been taught Hand Expression?: Yes  Feeding Mother's Current Feeding Choice: Breast Milk and Donor Milk  LATCH Score Latch: Repeated attempts needed to sustain latch, nipple held in mouth throughout feeding, stimulation needed to elicit sucking reflex.  Audible Swallowing: Spontaneous and intermittent  Type of Nipple: Everted at rest and after stimulation  Comfort (Breast/Nipple): Soft / non-tender  Hold (Positioning): Assistance needed to correctly position infant at breast and maintain latch.  LATCH Score: 8   Lactation Tools Discussed/Used Tools: Pump;Flanges Flange Size: 27 Breast pump type: Double-Electric Breast Pump Reason for Pumping: increase stimulation Pumping frequency: every 3 hrs for 15 minutes  Interventions Interventions: Breast feeding basics reviewed;Breast compression;Adjust position;Assisted with latch;Skin to skin;Support  pillows;DEBP;Breast massage;Position options;Hand express;Expressed milk;Education  Discharge    Consult Status Consult Status: Follow-up Date: 03/16/21 Follow-up type: In-patient    Correy Weidner  Nicholson-Springer 03/15/2021, 2:33 PM

## 2021-03-15 NOTE — Progress Notes (Signed)
PPD # 1 S/P NSVD  Live born female  Birth Weight: 4 lb 14.3 oz (2220 g) APGAR: 8, 9  Newborn Delivery   Birth date/time: 03/14/2021 05:38:00 Delivery type: Vaginal, Spontaneous     Baby name: Noa Delivering provider: Arlan Organ C   Episiotomy:None   Lacerations:2nd degree   Feeding: breast  Pain control at delivery: None   Subjective   Reports feeling well with no complaints. Breastfeeding going well. Minimal pain and bleeding is light. Husband and mother at the bedside.              Tolerating po/ No nausea or vomiting             Bleeding is light             Pain controlled with acetaminophen and ibuprofen (OTC)             Up ad lib / ambulatory / voiding without difficulties   Objective   A & O x 3, in no apparent distress              VS:  Vitals:   03/14/21 1330 03/14/21 1730 03/14/21 2130 03/15/21 0545  BP: 115/73 120/76 115/70 108/76  Pulse: 73 74  71  Resp: 17 18 16 16   Temp: 98 F (36.7 C) 98.2 F (36.8 C) 98.5 F (36.9 C) 97.7 F (36.5 C)  TempSrc: Oral Oral Oral Oral  SpO2:  100% 100% 100%  Weight:      Height:        LABS:  Recent Labs    03/13/21 0204 03/15/21 0545  WBC 8.9 11.6*  HGB 13.9 11.6*  HCT 41.0 34.3*  PLT 202 177    Blood type: --/--/O POS (04/06 0203)  Rubella: Immune (11/25 0000)   Vaccines:   TDaP   UTD                             COVID-19 ??   Gen: AAO x 3, NAD  Abdomen: soft, non-tender, non-distended             Fundus: firm, non-tender, U-1  Perineum: repair intact, no edema  Lochia: small  Extremities: no edema, no calf pain or tenderness   Assessment/Plan PPD # 1 30 y.o., 01-03-1998  Principal Problem:   Postpartum care following vaginal delivery 4/7  Doing well - stable status  Routine post partum orders Active Problems:   Preterm labor [redacted]w[redacted]d   SGA (small for gestational age)   SVD (spontaneous vaginal delivery)   Perineal laceration, second degree  Discussed perineal care and comfort measures.    Anxiety  Stable off medication  Close PP f/u for mood   Anticipate discharge tomorrow.   [redacted]w[redacted]d, MSN, CNM 03/15/2021, 8:41 AM

## 2021-03-15 NOTE — Lactation Note (Signed)
This note was copied from a baby's chart. Lactation Consultation Note  Patient Name: [name] [id number] Date: 03/15/2021 Reason for consult: Follow-up assessment;1st time breastfeeding;Late-preterm 34-36.6wks Age:28 hours P1, LPTI  Mom requested LC had question concerning breast flange ask for 21 mm breast flange, LC assessed breast tissue and 24 mm is better fit, LC changed flange to vertical position and mom felt it was better, LC explained should be space between flange and nipple to avoid friction and nipples being sore. Mom feels infant is latch well at the breast. LC did not observe latch due infant breastfeeding for 25 minutes and being supplemented with 10 mls of donor breast milk 1 hour prior to [facility] entering the room. LC encouraged mom to continue using DEBP every 3 hours for 15 minutes on initial setting, mom had pumped 3 mls of colostrum and had it in fridge, mom knows to latch infant at breast first,  then offer her EBM that she pumped before giving  Infant donor breast milk.  Mom will continue to follow LPTI feeding policy and guidelines. Mom knows on day 2 after latching infant at the breast to supplement infant with 10 to 20 mls of EBM/ an or donor breast milk per feeding.  Maternal Data    Feeding Mother's Current Feeding Choice: Breast Milk and Donor Milk Nipple Type: Extra Slow Flow  LATCH Score                    Lactation Tools Discussed/Used    Interventions    Discharge    Consult Status Consult Status: Follow-up Date: 03/15/21 Follow-up type: In-patient    Danelle Earthly 03/15/2021, 1:39 AM

## 2021-03-16 MED ORDER — ACETAMINOPHEN 500 MG PO TABS: 1000.0000 mg | ORAL_TABLET | Freq: Four times a day (QID) | ORAL | 0 refills | Status: DC

## 2021-03-16 MED ORDER — BENZOCAINE-MENTHOL 20-0.5 % EX AERO: 1.0000 "application " | INHALATION_SPRAY | CUTANEOUS | 1 refills | Status: DC | PRN

## 2021-03-16 MED ORDER — IBUPROFEN 600 MG PO TABS: 600.0000 mg | ORAL_TABLET | Freq: Four times a day (QID) | ORAL | 0 refills | Status: DC

## 2021-03-16 NOTE — Lactation Note (Signed)
This note was copied from a baby's chart. Lactation Consultation Note  Patient Name: Misty Ayers Date: 03/16/2021 Reason for consult: Follow-up assessment;Primapara;1st time breastfeeding;Infant < 6lbs;Early term 37-38.6wks, DAT (+) Age:28 hours  Visited with mom of 36 hours old ETI female, she's a P1. Mom and baby are going home today, reviewed discharge instructions, prevention/treatment of engorgement and sore nipples, and red flags on when to call baby's doctor.  Assisted mom with the latch, she required minimal assistance to obtain a deep latch, LC just assisted with repositioning baby and getting extra pillows. Baby fed on both breast in football hold with a few audible swallows noted upon breast compressions on both breasts; praised mom for her efforts.  She has been pumping every 3 hours and supplementing afterwards. Baby hasn't had formula, she has 50 oz of donor milk waiting for her at home; it's from a friend that she trust. Parents supplementing according to supplementation guidelines for LPI 20-30 ml today; stressed importance of supplementation after feedings at the breast because this baby is also DAT (+).   Encouraged mom to keep doing everything she's doing so far, limiting feedings at the breast to 20-30 minutes at a time, 8-12 feedings on cues/24 hours, pumping every 3 hours and supplementing after feedings at the breast. Family aware that it will take a few more weeks for baby to "catch up" to be able to fully empty the breast and that pumping after feedings will be crucial to protect her supply.  FOB and GOB (maternal) present and supportive. Family reported all questions and concerns were answered, they're all aware of LC OP services and will call if needed.  Maternal Data    Feeding Mother's Current Feeding Choice: Breast Milk and Donor Milk  LATCH Score Latch: Grasps breast easily, tongue down, lips flanged, rhythmical sucking.  Audible Swallowing: A  few with stimulation (with compressions)  Type of Nipple: Everted at rest and after stimulation  Comfort (Breast/Nipple): Soft / non-tender  Hold (Positioning): No assistance needed to correctly position infant at breast. (minimal assistance)  LATCH Score: 9   Lactation Tools Discussed/Used Tools: Pump;Flanges Flange Size: 27 Breast pump type: Double-Electric Breast Pump  Interventions Interventions: Breast feeding basics reviewed;Assisted with latch;Skin to skin;Breast massage;Hand express;Breast compression;Adjust position;Support pillows;DEBP  Discharge Discharge Education: Engorgement and breast care;Warning signs for feeding baby Pump: DEBP  Consult Status Consult Status: Complete Date: 03/16/21 Follow-up type: Call as needed    Meha Vidrine Venetia Constable 03/16/2021, 12:58 PM

## 2021-03-16 NOTE — Discharge Summary (Signed)
OB Discharge Summary  Patient Name: Misty Ayers DOB: 1991-10-04 MRN: [id number]  Date of admission: 03/12/2021 Delivering provider: [name]   Admitting diagnosis: Normal labor [O80, Z37.9] Intrauterine pregnancy: [redacted]w[redacted]d     Secondary diagnosis: Patient Active Problem List   Diagnosis Date Noted  . Anxiety 03/15/2021  . Postpartum care following vaginal delivery 4/7 03/14/2021  . SVD (spontaneous vaginal delivery) 03/14/2021  . Perineal laceration, second degree 03/14/2021  . Preterm labor [redacted]w[redacted]d 03/13/2021  . SGA (small for gestational age) 03/13/2021    Date of discharge: 03/16/2021   Discharge diagnosis: Principal Problem:   Postpartum care following vaginal delivery 4/7 Active Problems:   Preterm labor [redacted]w[redacted]d   SGA (small for gestational age)   SVD (spontaneous vaginal delivery)   Perineal laceration, second degree   Anxiety                                                         Post partum procedures:None  Augmentation: AROM and Pitocin Pain control: None  Laceration:2nd degree  Episiotomy:None  Complications: None  Hospital course:  Induction of Labor With Vaginal Delivery   30 y.o. yo [id number] at [redacted]w[redacted]d was admitted to the hospital 03/12/2021 for induction of labor.  Indication for induction: non-reassuring fetal tracing and per MFM.  Patient had an uncomplicated labor course as follows: Membrane Rupture Time/Date: 10:12 PM ,03/13/2021   Delivery Method:Vaginal, Spontaneous  Episiotomy: None  Lacerations:  2nd degree  Details of delivery can be found in separate delivery note.  Patient had a routine postpartum course. Patient is discharged home 03/16/21.  Newborn Data: Birth date:03/14/2021  Birth time:5:38 AM  Gender:Female  Living status:Living  Apgars:8 ,9  Weight:2220 g   Physical exam  Vitals:   03/15/21 0545 03/15/21 1500 03/15/21 2035 03/16/21 0539  BP: 108/76 116/83 118/84 118/82  Pulse: 71 67 65 72  Resp: 16 18 18 18   Temp: 97.7 F (36.5  C) 98.1 F (36.7 C) 97.8 F (36.6 C) 97.8 F (36.6 C)  TempSrc: Oral Axillary Axillary Oral  SpO2: 100% 99% 100% 100%  Weight:      Height:       General: alert, cooperative and no distress Lochia: appropriate Uterine Fundus: firm Perineum: repair intact, no edema DVT Evaluation: No evidence of DVT seen on physical exam. No significant calf/ankle edema. Labs: Lab Results  Component Value Date   WBC 11.6 (H) 03/15/2021   HGB 11.6 (L) 03/15/2021   HCT 34.3 (L) 03/15/2021   MCV 86.6 03/15/2021   PLT 177 03/15/2021   CMP Latest Ref Rng & Units 01/13/2018  Glucose 65 - 99 mg/dL 03/13/2018)  BUN 6 - 20 mg/dL 9  Creatinine 287(O - 6.76 mg/dL 7.20  Sodium 9.47 - 096 mmol/L 140  Potassium 3.5 - 5.1 mmol/L 3.5  Chloride 101 - 111 mmol/L 105  CO2 22 - 32 mmol/L 22  Calcium 8.9 - 10.3 mg/dL 9.8  Total Protein 6.5 - 8.1 g/dL 8.1  Total Bilirubin 0.3 - 1.2 mg/dL 1.2  Alkaline Phos 38 - 126 U/L 52  AST 15 - 41 U/L 24  ALT 14 - 54 U/L 14   Edinburgh Postnatal Depression Scale Screening Tool 03/15/2021  I have been able to laugh and see the funny side of things. 0  I have looked forward  with enjoyment to things. 0  I have blamed myself unnecessarily when things went wrong. 1  I have been anxious or worried for no good reason. 2  I have felt scared or panicky for no good reason. 0  Things have been getting on top of me. 0  I have been so unhappy that I have had difficulty sleeping. 0  I have felt sad or miserable. 0  I have been so unhappy that I have been crying. 0  The thought of harming myself has occurred to me. 0  Edinburgh Postnatal Depression Scale Total 3    Vaccines: TDaP UTD         COVID-19   ???  Discharge instructions:  per After Visit Summary  After Visit Meds:  Allergies as of 03/16/2021      Reactions   Penicillins Rash      Medication List    STOP taking these medications   ferrous sulfate 325 (65 FE) MG tablet   progesterone 200 MG capsule Commonly known as:  PROMETRIUM     TAKE these medications   acetaminophen 500 MG tablet Commonly known as: TYLENOL Take 2 tablets (1,000 mg total) by mouth every 6 (six) hours.   benzocaine-Menthol 20-0.5 % Aero Commonly known as: DERMOPLAST Apply 1 application topically as needed for irritation (perineal discomfort).   calcium carbonate 500 MG chewable tablet Commonly known as: TUMS - dosed in mg elemental calcium Chew 2 tablets by mouth 4 (four) times daily as needed for indigestion or heartburn.   doxylamine (Sleep) 25 MG tablet Commonly known as: UNISOM Take 25 mg by mouth at bedtime as needed for sleep.   ibuprofen 600 MG tablet Commonly known as: ADVIL Take 1 tablet (600 mg total) by mouth every 6 (six) hours.   prenatal multivitamin Tabs tablet Take 1 tablet by mouth daily at 12 noon.      Diet: routine diet  Activity: Advance as tolerated. Pelvic rest for 6 weeks.   Newborn Data: Live born female  Birth Weight: 4 lb 14.3 oz (2220 g) APGAR: 8, 9  Newborn Delivery   Birth date/time: 03/14/2021 05:38:00 Delivery type: Vaginal, Spontaneous     Named Noa Baby Feeding: Breast Disposition:home with mother  Delivery Report:  Review the Delivery Report for details.    Follow up:  Follow-up Information    June Leap, CNM. Schedule an appointment as soon as possible for a visit in 6 week(s).   Specialty: Certified Nurse Midwife Why: Please make an appointment for 6 weeks postpartum.  Contact information: [address] [place] [address] [phone]              June Leap, CNM, MSN 03/16/2021, 7:17 AM

## 2021-03-18 DIAGNOSIS — O165 Unspecified maternal hypertension, complicating the puerperium: Secondary | ICD-10-CM | POA: Diagnosis not present

## 2021-03-19 DIAGNOSIS — R030 Elevated blood-pressure reading, without diagnosis of hypertension: Secondary | ICD-10-CM | POA: Diagnosis not present

## 2021-03-25 DIAGNOSIS — H5203 Hypermetropia, bilateral: Secondary | ICD-10-CM | POA: Diagnosis not present

## 2021-04-02 ENCOUNTER — Other Ambulatory Visit: Payer: Self-pay

## 2021-04-03 ENCOUNTER — Encounter: Payer: Self-pay | Admitting: Family Medicine

## 2021-04-03 ENCOUNTER — Other Ambulatory Visit: Payer: Self-pay

## 2021-04-03 ENCOUNTER — Ambulatory Visit: Payer: 59 | Admitting: Family Medicine

## 2021-04-03 VITALS — BP 101/78 | HR 60 | Temp 98.4°F | Ht 62.25 in | Wt 130.0 lb

## 2021-04-03 DIAGNOSIS — F909 Attention-deficit hyperactivity disorder, unspecified type: Secondary | ICD-10-CM

## 2021-04-03 DIAGNOSIS — F53 Postpartum depression: Secondary | ICD-10-CM

## 2021-04-03 DIAGNOSIS — O99345 Other mental disorders complicating the puerperium: Secondary | ICD-10-CM | POA: Diagnosis not present

## 2021-04-03 DIAGNOSIS — F419 Anxiety disorder, unspecified: Secondary | ICD-10-CM

## 2021-04-03 MED ORDER — AMPHETAMINE-DEXTROAMPHETAMINE 20 MG PO TABS: 20.0000 mg | ORAL_TABLET | Freq: Two times a day (BID) | ORAL | 0 refills | Status: DC

## 2021-04-03 NOTE — Progress Notes (Signed)
Chief Complaint  Patient presents with  . Follow-up    Pt c/o anxiety and depression.  Pt also would like to start her ADHD medication back.    HPI: Misty Ayers is a 30 y.o. female here to discuss concerns about post-partum anxiety/depression. She delivered a baby girl on 03/14/21.  These are not new issues for patient. She states these were well-controlled with diet, exercise, etc until post-partum time. She is in NP school full-time as well.  Midwife started pt on zoloft $RemoveB'50mg'HZRFyKMZ$  daily - day 3 today. She notes some GI discomfort and jitteriness.  She was on zoloft years ago, maybe 2012.  Pt did one or two counseling sessions online in the past.  Pt is breastfeeding.   She also wants to restart Adderall for [facility]. Pediatrician said ok as long as ok with PCP. Pt is struggling with school.    Depression screen [facility] 2/9 01/12/2019 06/30/2018 06/24/2018  Decreased Interest 0 0 0  Down, Depressed, Hopeless 0 0 0  PHQ - 2 Score 0 0 0  Altered sleeping 1 - -  Tired, decreased energy 2 - -  Change in appetite 1 - -  Feeling bad or failure about yourself  0 - -  Trouble concentrating 0 - -  Moving slowly or fidgety/restless 1 - -  Suicidal thoughts 0 - -  PHQ-9 Score 5 - -    GAD 7 : Generalized Anxiety Score 01/12/2019 10/08/2018  Nervous, Anxious, on Edge 1 1  Control/stop worrying 1 2  Worry too much - different things 1 1  Trouble relaxing 1 1  Restless 2 1  Easily annoyed or irritable 1 1  Afraid - awful might happen 1 1  Total GAD 7 Score 8 8  Anxiety Difficulty - Somewhat difficult      Past Medical History:  Diagnosis Date  . ADHD   . Anemia   . Anxiety   . Depression     Past Surgical History:  Procedure Laterality Date  . LEEP      Social History   Socioeconomic History  . Marital status: Married    Spouse name: Not on file  . Number of children: Not on file  . Years of education: college  . Highest education level: Not on file  Occupational History   . Not on file  Tobacco Use  . Smoking status: Never Smoker  . Smokeless tobacco: Never Used  Vaping Use  . Vaping Use: Never used  Substance and Sexual Activity  . Alcohol use: Not Currently    Comment: 3-5  . Drug use: No  . Sexual activity: Yes  Other Topics Concern  . Not on file  Social History Narrative   Exercises   Social Determinants of Health   Financial Resource Strain: Not on file  Food Insecurity: Not on file  Transportation Needs: Not on file  Physical Activity: Not on file  Stress: Not on file  Social Connections: Not on file  Intimate Partner Violence: Not on file    Family History  Problem Relation Age of Onset  . Stroke Mother   . Hyperlipidemia Mother   . Hypertension Mother   . Mental illness Mother   . Stroke Brother   . Hypertension Father   . Heart attack Maternal Grandfather      Immunization History  Administered Date(s) Administered  . Influenza,inj,Quad PF,6+ Mos 09/28/2015, 09/04/2016, 08/12/2017  . MMR 10/12/2015  . Tdap 09/28/2015    Outpatient Encounter  Medications as of 04/03/2021  Medication Sig  . amphetamine-dextroamphetamine (ADDERALL) 20 MG tablet Take 1 tablet (20 mg total) by mouth 2 (two) times daily.  . benzocaine-Menthol (DERMOPLAST) 20-0.5 % AERO Apply 1 application topically as needed for irritation (perineal discomfort).  Marland Kitchen doxylamine, Sleep, (UNISOM) 25 MG tablet Take 25 mg by mouth at bedtime as needed for sleep.  Marland Kitchen ibuprofen (ADVIL) 600 MG tablet Take 1 tablet (600 mg total) by mouth every 6 (six) hours.  . Prenatal Vit-Fe Fumarate-FA (PRENATAL MULTIVITAMIN) TABS tablet Take 1 tablet by mouth daily at 12 noon.  Marland Kitchen acetaminophen (TYLENOL) 500 MG tablet Take 2 tablets (1,000 mg total) by mouth every 6 (six) hours. (Patient not taking: Reported on 04/03/2021)  . calcium carbonate (TUMS - DOSED IN MG ELEMENTAL CALCIUM) 500 MG chewable tablet Chew 2 tablets by mouth 4 (four) times daily as needed for indigestion or  heartburn. (Patient not taking: Reported on 04/03/2021)  . sertraline (ZOLOFT) 50 MG tablet Take 1 tablet by mouth daily.   No facility-administered encounter medications on file as of 04/03/2021.     ROS: Pertinent positives and negatives noted in HPI. Remainder of ROS non-contributory  Allergies  Allergen Reactions  . Penicillins Rash    BP 101/78 (BP Location: Left Arm, Patient Position: Sitting, Cuff Size: Normal)   Pulse 60   Temp 98.4 F (36.9 C) (Oral)   Ht 5' 2.25" (1.581 m)   Wt 130 lb (59 kg)   LMP 06/29/2020   SpO2 98%   BMI 23.59 kg/m   Physical Exam Constitutional:      General: She is not in acute distress.    Appearance: She is not ill-appearing.  Pulmonary:     Effort: No respiratory distress.  Neurological:     Mental Status: She is alert.  Psychiatric:        Attention and Perception: Attention normal.        Mood and Affect: Affect is tearful.        Behavior: Behavior normal. Behavior is cooperative.      A/P:  1. Attention deficit hyperactivity disorder (ADHD), unspecified ADHD type - baby's pediatrician ok with mom taking med and breastfeeding - mom plans to "pump & dump" after taking med - she will take $RemoveB'20mg'dJfMKVuw$  daily in AM and if needed 10-$RemoveBefo'20mg'GVSFCXEWdPp$  in afternoon Rx: - amphetamine-dextroamphetamine (ADDERALL) 20 MG tablet; Take 1 tablet (20 mg total) by mouth 2 (two) times daily.  Dispense: 60 tablet; Refill: 0  2. Anxiety 3. Postpartum depression - PHQ-9 = 21 (severe), GAD-7 = 20 (severe) - started 3 days ago on zoloft $RemoveB'50mg'kYoNKoEm$  daily - declines referral to counseling at this time - recommended daily walks - f/u in 3-4 wks (VV is fine) or sooner if needed  Discussed plan and reviewed medications with patient, including risks, benefits, and potential side effects. Pt expressed understand. All questions answered.   This visit occurred during the SARS-CoV-2 public health emergency.  Safety protocols were in place, including screening questions prior  to the visit, additional usage of staff PPE, and extensive cleaning of exam room while observing appropriate contact time as indicated for disinfecting solutions.

## 2021-04-24 ENCOUNTER — Encounter: Payer: Self-pay | Admitting: Family Medicine

## 2021-05-21 ENCOUNTER — Other Ambulatory Visit: Payer: Self-pay | Admitting: Family Medicine

## 2021-05-21 DIAGNOSIS — F909 Attention-deficit hyperactivity disorder, unspecified type: Secondary | ICD-10-CM

## 2021-05-21 NOTE — Telephone Encounter (Signed)
Refill request for: Adderall 20 mg LR 04/03/21, #60, 0 rf LOV 04/03/21 FOV  none scheduled.    Please review and advise.  Thanks.  Dm/cma

## 2021-05-22 MED ORDER — AMPHETAMINE-DEXTROAMPHETAMINE 20 MG PO TABS: 20.0000 mg | ORAL_TABLET | Freq: Two times a day (BID) | ORAL | 0 refills | Status: DC

## 2021-05-30 DIAGNOSIS — Z01419 Encounter for gynecological examination (general) (routine) without abnormal findings: Secondary | ICD-10-CM | POA: Diagnosis not present

## 2021-05-30 DIAGNOSIS — Z6822 Body mass index (BMI) 22.0-22.9, adult: Secondary | ICD-10-CM | POA: Diagnosis not present

## 2021-05-31 ENCOUNTER — Other Ambulatory Visit (HOSPITAL_COMMUNITY): Payer: Self-pay

## 2021-05-31 ENCOUNTER — Other Ambulatory Visit: Payer: Self-pay | Admitting: Family Medicine

## 2021-05-31 DIAGNOSIS — F909 Attention-deficit hyperactivity disorder, unspecified type: Secondary | ICD-10-CM

## 2021-05-31 NOTE — Telephone Encounter (Signed)
Patient is calling to get a refill on her Adderall. If approved, please send to [facility] and call her to let her know that it has been sent in.  Patient is completely out and aware that her provider will not be in until Wednesday, but wants to know if another provider could fill it for her.

## 2021-05-31 NOTE — Telephone Encounter (Signed)
Sent to [facility] An of the day.

## 2021-05-31 NOTE — Telephone Encounter (Signed)
Last OV 04/03/21 Last fill went to [facility],  I called pt and she informed me that medication was rejected at [facility] and that it needed to be sent to [facility] pharmacy. Please advise.

## 2021-06-03 NOTE — Telephone Encounter (Signed)
Please see message and advise.  Thank you. ° °

## 2021-06-05 ENCOUNTER — Other Ambulatory Visit (HOSPITAL_COMMUNITY): Payer: Self-pay

## 2021-06-05 MED ORDER — AMPHETAMINE-DEXTROAMPHETAMINE 20 MG PO TABS
20.0000 mg | ORAL_TABLET | Freq: Two times a day (BID) | ORAL | 0 refills | Status: DC
  Filled 2021-06-05: qty 60, 30d supply, fill #0

## 2021-06-05 NOTE — Telephone Encounter (Signed)
Med refilled. Needs appt prior to next refill (q89mo f/u)

## 2021-07-01 ENCOUNTER — Telehealth: Payer: Self-pay

## 2021-07-01 DIAGNOSIS — F909 Attention-deficit hyperactivity disorder, unspecified type: Secondary | ICD-10-CM

## 2021-07-01 NOTE — Telephone Encounter (Signed)
Pt needed to make med f/u appt with Dr C for Adderall. They were no visits available. She is scheduled for a TOC appt with Dr Veto Kemps on 08/30/21. Can Dr C refill her Adderall  until Dr Veto Kemps takes over her care?  Her pharmacy is [phone]  Thank you

## 2021-07-03 ENCOUNTER — Other Ambulatory Visit (HOSPITAL_BASED_OUTPATIENT_CLINIC_OR_DEPARTMENT_OTHER): Payer: Self-pay

## 2021-07-03 MED ORDER — AMPHETAMINE-DEXTROAMPHETAMINE 20 MG PO TABS
20.0000 mg | ORAL_TABLET | Freq: Two times a day (BID) | ORAL | 0 refills | Status: DC
  Filled 2021-07-03: qty 60, 30d supply, fill #0

## 2021-07-03 NOTE — Telephone Encounter (Signed)
Rx for this month and next sent to pharm

## 2021-08-15 ENCOUNTER — Telehealth: Payer: Self-pay

## 2021-08-15 NOTE — Telephone Encounter (Signed)
PA for Adderall submitted through cover my meds.  Awaiting response.  Dm/cma   Key: [id number]

## 2021-08-16 NOTE — Telephone Encounter (Signed)
Received a letter sating that patient does not have active eligibility with [employer].  Dm/cma

## 2021-08-30 ENCOUNTER — Ambulatory Visit (INDEPENDENT_AMBULATORY_CARE_PROVIDER_SITE_OTHER): Payer: 59 | Admitting: Family Medicine

## 2021-08-30 ENCOUNTER — Encounter: Payer: Self-pay | Admitting: Family Medicine

## 2021-08-30 ENCOUNTER — Other Ambulatory Visit: Payer: Self-pay

## 2021-08-30 VITALS — BP 128/86 | HR 94 | Temp 98.0°F | Ht 62.0 in | Wt 138.0 lb

## 2021-08-30 DIAGNOSIS — Z862 Personal history of diseases of the blood and blood-forming organs and certain disorders involving the immune mechanism: Secondary | ICD-10-CM

## 2021-08-30 DIAGNOSIS — Z23 Encounter for immunization: Secondary | ICD-10-CM | POA: Diagnosis not present

## 2021-08-30 DIAGNOSIS — F339 Major depressive disorder, recurrent, unspecified: Secondary | ICD-10-CM | POA: Insufficient documentation

## 2021-08-30 DIAGNOSIS — F909 Attention-deficit hyperactivity disorder, unspecified type: Secondary | ICD-10-CM | POA: Diagnosis not present

## 2021-08-30 MED ORDER — SERTRALINE HCL 50 MG PO TABS: 50.0000 mg | ORAL_TABLET | Freq: Every day | ORAL | 3 refills | Status: DC

## 2021-08-30 MED ORDER — AMPHETAMINE-DEXTROAMPHETAMINE 20 MG PO TABS: 20.0000 mg | ORAL_TABLET | Freq: Two times a day (BID) | ORAL | 0 refills | Status: DC

## 2021-08-30 MED ORDER — AMPHETAMINE-DEXTROAMPHETAMINE 10 MG PO TABS: 20.0000 mg | ORAL_TABLET | Freq: Every day | ORAL | 0 refills | Status: DC

## 2021-08-30 NOTE — Progress Notes (Signed)
[id number] 

## 2021-08-30 NOTE — Progress Notes (Signed)
[facility] [address] [place] [address] Dept: [phone] Dept Fax: [phone]  Transfer of Care Office Visit  Subjective:    Patient ID: Misty Ayers, female    DOB: Jul 13, 1991, 30 y.o..   MRN: [id number]  Chief Complaint  Patient presents with   Transitions Of Care    Est care. Med refill/check. No main concerns    History of Present Illness:  Patient is in today to establish care. Misty Ayers was born in the [place] the [place]. Her family moved to the [place] in 1999 due to armed conflicts occurring in their country. She lived in [place] as a child, but went to [place] for college, majoring in [place] and nursing. She is married (3 years) and has a 5 mo. daughter. She is currently a Art gallery manager. She denies tobacco or drug use. She admits to drinking a glass of wine up to 5 nights a week.  Misty Ayers is a [id number]. Her daughter was born at [redacted] weeks gestation via NSVD. She was SGA (4 lbs), but has done well. Misty Ayers had some anemia during the pregnancy, but had no other complications. She is currently breastfeeding.  Misty Ayers has a history of ADHD. Prior to starting on medication, she was having trouble focusing on school work and finding it difficult to organize tasks at home. The medication has helped. While breastfeeding, she has been taking the Adderall 10 mg, 2 each morning after breastfeeding. she then pumps and discards midday. Her daughters growth has been good.  Misty Ayers has a history of depression. She had stopped taking her Zoloft, but now recognizes that her depression has been increasing. Today, it is worse, as they had a pet dog euthanized 2 days ago.  Past Medical History: Patient Active Problem List   Diagnosis Date Noted   Depression, recurrent (HCC) 08/30/2021   Attention deficit hyperactivity disorder (ADHD) 01/12/2019   Past Surgical History:  Procedure Laterality  Date   LEEP     Family History  Problem Relation Age of Onset   Kidney disease Mother    Stroke Mother    Hyperlipidemia Mother    Hypertension Mother    Mental illness Mother    Hypertension Father    Stroke Brother    Heart attack Maternal Grandfather    Outpatient Medications Prior to Visit  Medication Sig Dispense Refill   Ferrous Sulfate (IRON SUPPLEMENT PO) Take by mouth.     ibuprofen (ADVIL) 600 MG tablet Take 1 tablet (600 mg total) by mouth every 6 (six) hours. 30 tablet 0   Prenatal Vit-Fe Fumarate-FA (PRENATAL MULTIVITAMIN) TABS tablet Take 1 tablet by mouth daily at 12 noon.     amphetamine-dextroamphetamine (ADDERALL) 20 MG tablet Take 1 tablet (20 mg total) by mouth 2 (two) times daily. 60 tablet 0   sertraline (ZOLOFT) 50 MG tablet Take 1 tablet by mouth daily.     nystatin cream (MYCOSTATIN) Apply topically 4 (four) times daily.     benzocaine-Menthol (DERMOPLAST) 20-0.5 % AERO Apply 1 application topically as needed for irritation (perineal discomfort). (Patient not taking: Reported on 08/30/2021) 56 g 1   doxylamine, Sleep, (UNISOM) 25 MG tablet Take 25 mg by mouth at bedtime as needed for sleep. (Patient not taking: Reported on 08/30/2021)     No facility-administered medications prior to visit.   Allergies  Allergen Reactions   Penicillins Rash   Objective:   Today's Vitals   08/30/21  1412  BP: 128/86  Pulse: 94  Temp: 98 F (36.7 C)  TempSrc: Temporal  SpO2: 98%  Weight: 138 lb (62.6 kg)  Height: 5\' 2"  (1.575 m)   Body mass index is 25.24 kg/m.   General: Well developed, well nourished. No acute distress. Psych: Alert and oriented. Patient became tearful while discussing mood issues.  Health Maintenance Due  Topic Date Due   COVID-19 Vaccine (1) Never done   Hepatitis C Screening  Never done   INFLUENZA VACCINE  07/08/2021      Assessment & Plan:   1. Depression, recurrent (HCC) I will restart Misty Ayers on Zoloft. I will also refer her  for counseling. We did discuss the stress she may be experiencing from carrying so many responsibiliites. I will recheck on her in 6 weeks.  - sertraline (ZOLOFT) 50 MG tablet; Take 1 tablet (50 mg total) by mouth daily.  Dispense: 90 tablet; Refill: 3 - Ambulatory referral to Psychology  2. Attention deficit hyperactivity disorder (ADHD), unspecified ADHD type I will continue her on Adderall.  - amphetamine-dextroamphetamine (ADDERALL) 10 MG tablet; Take 2 tablets (20 mg total) by mouth daily.  Dispense: 60 tablet; Refill: 0  3. Need for influenza vaccination  - Flu Vaccine QUAD 6+ mos PF IM (Fluarix Quad PF)  4. History of anemia  - CBC  09/07/2021, MD

## 2021-09-01 LAB — CBC
Hematocrit: 40.2 % (ref 34.0–46.6)
Hemoglobin: 13.3 g/dL (ref 11.1–15.9)
MCH: 29.4 pg (ref 26.6–33.0)
MCHC: 33.1 g/dL (ref 31.5–35.7)
MCV: 89 fL (ref 79–97)
Platelets: 238 10*3/uL (ref 150–450)
RBC: 4.52 x10E6/uL (ref 3.77–5.28)
RDW: 12.5 % (ref 11.7–15.4)
WBC: 6.8 10*3/uL (ref 3.4–10.8)

## 2021-10-11 ENCOUNTER — Ambulatory Visit (INDEPENDENT_AMBULATORY_CARE_PROVIDER_SITE_OTHER): Payer: 59 | Admitting: Family Medicine

## 2021-10-11 ENCOUNTER — Telehealth: Payer: Self-pay

## 2021-10-11 ENCOUNTER — Other Ambulatory Visit: Payer: Self-pay

## 2021-10-11 VITALS — BP 134/90 | HR 101 | Temp 98.2°F | Ht 62.0 in | Wt 135.0 lb

## 2021-10-11 DIAGNOSIS — F909 Attention-deficit hyperactivity disorder, unspecified type: Secondary | ICD-10-CM | POA: Diagnosis not present

## 2021-10-11 DIAGNOSIS — F339 Major depressive disorder, recurrent, unspecified: Secondary | ICD-10-CM | POA: Diagnosis not present

## 2021-10-11 MED ORDER — AMPHETAMINE-DEXTROAMPHETAMINE 10 MG PO TABS: 20.0000 mg | ORAL_TABLET | Freq: Every day | ORAL | 0 refills | Status: DC

## 2021-10-11 NOTE — Progress Notes (Signed)
[facility]ARE [facility] [address] [place] [address] Dept: [phone] Dept Fax: [phone]  Office Visit  Subjective:    Patient ID: Misty Ayers, female    DOB: January 07, 1991, 30 y.o..   MRN: [id number]  Chief Complaint  Patient presents with   Follow-up    F/u ADHD/depression.     History of Present Illness:  Patient is in today for reassessment of her depression. At her last appointment, we restarted ms. Borkowski on an antidepressant (Zoloft). I felt that mch of this was being triggered by the multiple stressors she currently is experiencing (recent birth of child, attending NP school). She notes her mood is improved now. Initially, she felt her emotions were fairly flat, but these have been gradually improving over the past 5 weeks. She is still needing to work out scheduling with behavioral health for counseling.   Ms. Ginzburg has a history of ADHD. She is using this without problems currently. School is going well. She is looking to take her boards in Feb. She did note there was an issue with the prior authorization of the medication through her insurance.  Past Medical History: Patient Active Problem List   Diagnosis Date Noted   Depression, recurrent (HCC) 08/30/2021   Attention deficit hyperactivity disorder (ADHD) 01/12/2019   Past Surgical History:  Procedure Laterality Date   LEEP     Family History  Problem Relation Age of Onset   Kidney disease Mother    Stroke Mother    Hyperlipidemia Mother    Hypertension Mother    Mental illness Mother    Hypertension Father    Stroke Brother    Heart attack Maternal Grandfather    Outpatient Medications Prior to Visit  Medication Sig Dispense Refill   amphetamine-dextroamphetamine (ADDERALL) 10 MG tablet Take 2 tablets (20 mg total) by mouth daily. 60 tablet 0   Ferrous Sulfate (IRON SUPPLEMENT PO) Take by mouth.     ibuprofen (ADVIL) 600 MG tablet Take 1 tablet  (600 mg total) by mouth every 6 (six) hours. 30 tablet 0   nystatin cream (MYCOSTATIN) Apply topically 4 (four) times daily.     Prenatal Vit-Fe Fumarate-FA (PRENATAL MULTIVITAMIN) TABS tablet Take 1 tablet by mouth daily at 12 noon.     sertraline (ZOLOFT) 50 MG tablet Take 1 tablet (50 mg total) by mouth daily. 90 tablet 3   No facility-administered medications prior to visit.   Allergies  Allergen Reactions   Penicillins Rash   Objective:   Today's Vitals   10/11/21 1444  BP: 134/90  Pulse: (!) 101  Temp: 98.2 F (36.8 C)  TempSrc: Temporal  SpO2: 96%  Weight: 135 lb (61.2 kg)  Height: 5\' 2"  (1.575 m)   Body mass index is 24.69 kg/m.   General: Well developed, well nourished. No acute distress. Psych: Alert and oriented. Normal mood and affect.  Health Maintenance Due  Topic Date Due   COVID-19 Vaccine (1) Never done   Pneumococcal Vaccine 12-46 Years old (1 - PCV) Never done   Hepatitis C Screening  Never done     Assessment & Plan:   1. Attention deficit hyperactivity disorder (ADHD), unspecified ADHD type I will renew her Adderall. I will have my MA talk to her about the PA issues. I will plan to see Ms. Stansel back in 3 months.   - amphetamine-dextroamphetamine (ADDERALL) 10 MG tablet; Take 2 tablets (20 mg total) by mouth daily.  Dispense: 60 tablet; Refill:  0  2. Depression, recurrent (HCC) Improved on Zoloft. I encouraged her to follow through with counseling. I will reassess her in 3 months.  Loyola Mast, MD

## 2021-10-11 NOTE — Telephone Encounter (Signed)
Submitted PA for Adderall 10 mg tabs through cover my meds.  Received a message back stating:"  your PA has been resolved, no additional PA is required.  Dm/cma

## 2021-10-15 NOTE — Telephone Encounter (Signed)
PA faxed through cover my meds. Awaiting response.   Dm/cma

## 2022-01-16 ENCOUNTER — Other Ambulatory Visit: Payer: Self-pay

## 2022-01-17 ENCOUNTER — Ambulatory Visit (INDEPENDENT_AMBULATORY_CARE_PROVIDER_SITE_OTHER): Payer: 59 | Admitting: Family Medicine

## 2022-01-17 DIAGNOSIS — F339 Major depressive disorder, recurrent, unspecified: Secondary | ICD-10-CM

## 2022-01-17 DIAGNOSIS — F909 Attention-deficit hyperactivity disorder, unspecified type: Secondary | ICD-10-CM | POA: Diagnosis not present

## 2022-01-17 MED ORDER — AMPHETAMINE-DEXTROAMPHETAMINE 10 MG PO TABS: 20.0000 mg | ORAL_TABLET | Freq: Every day | ORAL | 0 refills | Status: DC

## 2022-01-17 MED ORDER — SERTRALINE HCL 50 MG PO TABS: 50.0000 mg | ORAL_TABLET | Freq: Every day | ORAL | 3 refills | Status: DC

## 2022-01-17 NOTE — Progress Notes (Signed)
[facility] [address] [place] [address] Dept: [phone] Dept Fax: [phone]  Chronic Care Office Visit  Subjective:    Patient ID: Misty Ayers, female    DOB: 1991-07-24, 31 y.o..   MRN: [id number]  Chief Complaint  Patient presents with   Follow-up    3 month f/u ADHD/meds.  No concerns.      History of Present Illness:  Patient is in today for reassessment of chronic medical issues.  Patient is in today for reassessment of her depression. 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 She continues on Zoloft. She wonders about tapering off on this at some point, but plans to continue for now.   Misty Ayers has a history of ADHD. She is using this without problems currently. She notes she has continued the 20 mg in the morning, but occasionally has taken a 10 mg dose in the afternoon when she has longer study sessions. She is being very cautious to not breastfeed within 6 hours of her meds.  Past Medical History: Patient Active Problem List   Diagnosis Date Noted   Depression, recurrent (HCC) 08/30/2021   Attention deficit hyperactivity disorder (ADHD) 01/12/2019   Past Surgical History:  Procedure Laterality Date   LEEP     Family History  Problem Relation Age of Onset   Kidney disease Mother    Stroke Mother    Hyperlipidemia Mother    Hypertension Mother    Mental illness Mother    Hypertension Father    Stroke Brother    Heart attack Maternal Grandfather    Outpatient Medications Prior to Visit  Medication Sig Dispense Refill   ibuprofen (ADVIL) 600 MG tablet Take 1 tablet (600 mg total) by mouth every 6 (six) hours. 30 tablet 0   nystatin cream (MYCOSTATIN) Apply topically 4 (four) times daily.     Prenatal Vit-Fe Fumarate-FA (PRENATAL MULTIVITAMIN) TABS tablet Take 1 tablet by mouth daily at 12  noon.     amphetamine-dextroamphetamine (ADDERALL) 10 MG tablet Take 2 tablets (20 mg total) by mouth daily. 60 tablet 0   sertraline (ZOLOFT) 50 MG tablet Take 1 tablet (50 mg total) by mouth daily. 90 tablet 3   Ferrous Sulfate (IRON SUPPLEMENT PO) Take by mouth.     No facility-administered medications prior to visit.    Allergies  Allergen Reactions   Penicillins Rash      Objective:   Today's Vitals   01/17/22 1405  BP: 124/76  Pulse: (!) 120  Temp: 97.7 F (36.5 C)  TempSrc: Temporal  SpO2: 98%  Weight: 144 lb 6.4 oz (65.5 kg)  Height: 5\' 2"  (1.575 m)   Body mass index is 26.41 kg/m.   General: Well developed, well nourished. No acute distress. Psych: Alert and oriented. Normal mood and affect.  Health Maintenance Due  Topic Date Due   Hepatitis C Screening  Never done   COVID-19 Vaccine (4 - Booster for Pfizer series) 02/05/2021     Assessment & Plan:   1. Depression, recurrent (HCC) Mood is stable. 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 We would then plan to reduce her dose to 25 mg for 2 weeks and then stop.  - sertraline (ZOLOFT) 50 MG tablet; Take 1 tablet (50 mg  total) by mouth daily.  Dispense: 90 tablet; Refill: 3  2. Attention deficit hyperactivity disorder (ADHD), unspecified ADHD type Stable. I will continue her Adderall. We discussed ways to manage around her breastfeeding. She plans to wean Anette Riedel at 1 year, so this will no longer be an issue.  - amphetamine-dextroamphetamine (ADDERALL) 10 MG tablet; Take 2 tablets (20 mg total) by mouth daily.  Dispense: 60 tablet; Refill: 0  Loyola Mast, MD

## 2022-01-23 ENCOUNTER — Telehealth: Payer: Self-pay

## 2022-01-23 NOTE — Telephone Encounter (Signed)
PA for adderall 10 mg submitted through [email or web address]  with ID# [id number].  Waiting on response. Dm/cma

## 2022-01-28 NOTE — Telephone Encounter (Signed)
Pt aware.

## 2022-01-28 NOTE — Telephone Encounter (Signed)
Lft detailed VM that RX was approved through [employer] Benefits on 01/27/22.  Dm/cma

## 2022-01-28 NOTE — Telephone Encounter (Signed)
Noted. Dm/cma  

## 2022-02-13 ENCOUNTER — Encounter: Payer: Self-pay | Admitting: Family Medicine

## 2022-03-07 ENCOUNTER — Ambulatory Visit (INDEPENDENT_AMBULATORY_CARE_PROVIDER_SITE_OTHER): Payer: 59 | Admitting: Family Medicine

## 2022-03-07 VITALS — BP 120/78 | HR 97 | Temp 97.8°F | Ht 62.0 in | Wt 148.4 lb

## 2022-03-07 DIAGNOSIS — F418 Other specified anxiety disorders: Secondary | ICD-10-CM | POA: Diagnosis not present

## 2022-03-07 DIAGNOSIS — F909 Attention-deficit hyperactivity disorder, unspecified type: Secondary | ICD-10-CM

## 2022-03-07 MED ORDER — SERTRALINE HCL 100 MG PO TABS: 100.0000 mg | ORAL_TABLET | Freq: Every day | ORAL | 5 refills | Status: DC

## 2022-03-07 NOTE — Progress Notes (Signed)
?[facility] ?[place] ?[place] [address] ?Dept: [phone] ?Dept Fax: [phone] ? ?Office Visit ? ?Subjective:  ? ? Patient ID: Misty Ayers, female    DOB: 1991-07-31, 31 y.o..   MRN: [id number] ? ?Chief Complaint  ?Patient presents with  ? Follow-up  ?  F/u meds.    ? ? ?History of Present Illness: ? ?Patient is in today for re-evaluation of her depression and ADHD. Misty Ayers notes that she has been having recent episodes of tachycardia. These have been distressing for her. She was concerned that they might represent serotonin syndrome. She is currently on sertraline 50 mg daily for depression. 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 Since I last saw her, she has taken the exam twice and not passed. She has spoken with her program and they are concerned that she may be over studying. She admits to some anxiety around this. At the same time, she has continued to try and work and is raising her daughter, who is almost 89 years old. She also admits to picking at her skin on her legs. ? ?Past Medical History: ?Patient Active Problem List  ? Diagnosis Date Noted  ? Depression, recurrent ([place]) 08/30/2021  ? Attention deficit hyperactivity disorder (ADHD) 01/12/2019  ? ?Past Surgical History:  ?Procedure Laterality Date  ? LEEP    ? ?Family History  ?Problem Relation Age of Onset  ? Kidney disease Mother   ? Stroke Mother   ? Hyperlipidemia Mother   ? Hypertension Mother   ? Mental illness Mother   ? Hypertension Father   ? Stroke Brother   ? Heart attack Maternal Grandfather   ? ?Outpatient Medications Prior to Visit  ?Medication Sig Dispense Refill  ? amphetamine-dextroamphetamine (ADDERALL) 10 MG tablet Take 2 tablets (20 mg total) by mouth daily. 60 tablet 0  ? ibuprofen (ADVIL) 600 MG tablet Take 1 tablet (600 mg total) by mouth every 6 (six) hours. 30 tablet 0  ? nystatin cream  (MYCOSTATIN) Apply topically 4 (four) times daily.    ? Prenatal Vit-Fe Fumarate-FA (PRENATAL MULTIVITAMIN) TABS tablet Take 1 tablet by mouth daily at 12 noon.    ? sertraline (ZOLOFT) 50 MG tablet Take 1 tablet (50 mg total) by mouth daily. 90 tablet 3  ? ?No facility-administered medications prior to visit.  ? ?Allergies  ?Allergen Reactions  ? Penicillins Rash  ?   ?Objective:  ? ?Today's Vitals  ? 03/07/22 0805  ?BP: 120/78  ?Pulse: 97  ?Temp: 97.8 ?F (36.6 ?C)  ?TempSrc: Temporal  ?SpO2: 98%  ?Weight: 148 lb 6.4 oz (67.3 kg)  ?Height: 5\' 2"  (1.575 m)  ? ?Body mass index is 27.14 kg/m?.  ? ?General: Well developed, well nourished. No acute distress. ?Neuro: Normal LE reflexes without hyperreflexia. No clonus. Babinski reflexes are normal. ?Psych: Alert and oriented. Normal mood and affect. ? ?Health Maintenance Due  ?Topic Date Due  ? Hepatitis C Screening  Never done  ? COVID-19 Vaccine (4 - Booster for Pfizer series) 02/05/2021  ?   ? ?  03/07/2022  ?  8:36 AM 01/17/2022  ?  2:02 PM 04/03/2021  ?  1:52 PM  ?Depression screen PHQ 2/9  ?Decreased Interest 3 0 2  ?Down, Depressed, Hopeless 3 1 2   ?PHQ - 2 Score 6 1 4   ?Altered sleeping 3 1 3   ?Tired, decreased energy 3  1 3  ?Change in appetite 3 1 2   ?Feeling bad or failure about yourself  3 1 3   ?Trouble concentrating 2 1 3   ?Moving slowly or fidgety/restless 2 0 2  ?Suicidal thoughts 1 0 1  ?PHQ-9 Score 23 6 21   ?Difficult doing work/chores Very difficult Somewhat difficult Somewhat difficult  ? ? ?  03/07/2022  ?  8:37 AM 04/03/2021  ?  1:53 PM 01/12/2019  ?  3:21 PM 10/08/2018  ? 10:12 AM  ?GAD 7 : Generalized Anxiety Score  ?Nervous, Anxious, on Edge 3 3 1 1   ?Control/stop worrying 3 3 1 2   ?Worry too much - different things 3 3 1 1   ?Trouble relaxing 3 3 1 1   ?Restless 3 2 2 1   ?Easily annoyed or irritable 3 3 1 1   ?Afraid - awful might happen 3 3 1 1   ?Total GAD 7 Score 21 20 8 8   ?Anxiety Difficulty Very difficult Very difficult  Somewhat difficult   ? ? ?Assessment & Plan:  ? ?1. Depression with anxiety ?Misty Ayers  has not physical or vital signs consistent with serotonin syndrome. However, her PHQ9 score has increased significantly and she continues to have anxiety. 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 She is on a low dose of sertraline. I recommend we go up to 100 mg. I agreed to her taking some time off work to try and stabilize her mood and help her prepare for her next exam. I would like to see her back in 6 weeks to reassess. ? ?- sertraline (ZOLOFT) 100 MG tablet; Take 1 tablet (100 mg total) by mouth daily.  Dispense: 30 tablet; Refill: 5 ? ?2. Attention deficit hyperactivity disorder (ADHD), unspecified ADHD type ?The tachycardia is likely due to a combination of anxiety and her use of Adderall. I am concerned that the stimulant may be exacerbating her anxiety symptoms. However, she feels she needs the Adderall to concentrate. Wd discussed cautious use as needed over the next month. ? ?Return in about 6 weeks (around 04/18/2022) for Reassessment.  ? ?Haydee Salter, MD ?

## 2022-03-17 ENCOUNTER — Other Ambulatory Visit: Payer: Self-pay | Admitting: Family Medicine

## 2022-03-17 DIAGNOSIS — F909 Attention-deficit hyperactivity disorder, unspecified type: Secondary | ICD-10-CM

## 2022-03-18 MED ORDER — AMPHETAMINE-DEXTROAMPHETAMINE 20 MG PO TABS: 20.0000 mg | ORAL_TABLET | Freq: Every day | ORAL | 0 refills | Status: DC

## 2022-03-18 NOTE — Telephone Encounter (Signed)
Refill request for: ?Adderall 10 mg ?LR 01/17/22, # 60, 0 rf ?LOV 03/07/22 ?FOV 04/18/22 ? ?Please review and advise.  ?Thanks. Dm/cma ? ?

## 2022-03-18 NOTE — Telephone Encounter (Signed)
Note form patient that [facility] does not have the 10 mg dose in stock, so I will try switching to the 20 mg dose.  ?

## 2022-04-07 ENCOUNTER — Encounter: Payer: Self-pay | Admitting: Family Medicine

## 2022-04-07 ENCOUNTER — Ambulatory Visit (INDEPENDENT_AMBULATORY_CARE_PROVIDER_SITE_OTHER): Payer: 59 | Admitting: Family Medicine

## 2022-04-07 DIAGNOSIS — F418 Other specified anxiety disorders: Secondary | ICD-10-CM

## 2022-04-07 MED ORDER — SERTRALINE HCL 50 MG PO TABS: 150.0000 mg | ORAL_TABLET | Freq: Every day | ORAL | 2 refills | Status: DC

## 2022-04-07 NOTE — Addendum Note (Signed)
Addended by: Loyola Mast on: 04/07/2022 05:20 PM ? ? Modules accepted: Orders ? ?

## 2022-04-07 NOTE — Progress Notes (Signed)
?[name] ?[facility] ?[address] ?[place] [address] ?Dept: [phone] ?Dept Fax: [phone] ? ?Office Visit ? ?Subjective:  ? ? Patient ID: Misty Ayers, female    DOB: 1991-12-02, 31 y.o..   MRN: [id number] ? ?Chief Complaint  ?Patient presents with  ? Follow-up  ?  6 week follow wants to discuss the new dosage of the SSRI discuss last 2 weeks off work.  ? ? ?History of Present Illness: ? ?Patient is in today for reassessment of her depression. Misty Ayers admits that her mood has been doing worse. She reports that about a week ago, she found her self Googling how to compose a suicide note. As part of the search, she saw a number for a Suicide Hotline. She called and spoke with someone and did feel better afterwards. She also disclosed to her husband and a friend about what had happened. She does note she had no specific plans and had taken no actions towards suicide. Since I last saw her, she did take 2 weeks off work and found this to be somewhat helpful. As she has approached going back to work, she has found her anxiety to be increased. She notes that she continues to sleep more than usual. 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 She was contacted regarding counseling, but had decided she couldn't manage another place she had to go to. She has been looking into online alternatives, but they are checking her insurance to see if it would pay for this. However, now she feels like maybe she should move ahead with in-person care. ? ?Misty Ayers does note that her tachycardia issues are improved. She has only taken her Adderall one time int he past month. ? ?Past Medical History: ?Patient Active Problem List  ? Diagnosis Date Noted  ? Depression, recurrent (HCC) 08/30/2021  ? Attention deficit hyperactivity disorder (ADHD) 01/12/2019  ? ?Past Surgical History:  ?Procedure Laterality Date  ? LEEP    ? ?Family History   ?Problem Relation Age of Onset  ? Kidney disease Mother   ? Stroke Mother   ? Hyperlipidemia Mother   ? Hypertension Mother   ? Mental illness Mother   ? Hypertension Father   ? Stroke Brother   ? Heart attack Maternal Grandfather   ? ?Outpatient Medications Prior to Visit  ?Medication Sig Dispense Refill  ? amphetamine-dextroamphetamine (ADDERALL) 20 MG tablet Take 1 tablet (20 mg total) by mouth daily. 30 tablet 0  ? ibuprofen (ADVIL) 600 MG tablet Take 1 tablet (600 mg total) by mouth every 6 (six) hours. 30 tablet 0  ? nystatin cream (MYCOSTATIN) Apply topically 4 (four) times daily.    ? Prenatal Vit-Fe Fumarate-FA (PRENATAL MULTIVITAMIN) TABS tablet Take 1 tablet by mouth daily at 12 noon.    ? sertraline (ZOLOFT) 100 MG tablet Take 1 tablet (100 mg total) by mouth daily. 30 tablet 5  ? ?No facility-administered medications prior to visit.  ? ?Allergies  ?Allergen Reactions  ? Penicillins Rash  ? ?   ?Objective:  ? ?Today's Vitals  ? 04/07/22 1310  ?BP: 132/82  ?Pulse: 99  ?Temp: 98 ?F (36.7 ?C)  ?SpO2: 99%  ?Weight: 148 lb 12.8 oz (67.5 kg)  ?Height: 5\' 2"  (1.575 m)  ? ?Body mass index is 27.22 kg/m?.  ? ?General: Well developed, well nourished. No acute distress. ?Psych: Alert and oriented. Depressed mood with sad affect, tearfulness, and some mild  tremor. ? ?Health Maintenance Due  ?Topic Date Due  ? Hepatitis C Screening  Never done  ? COVID-19 Vaccine (4 - Booster for Pfizer series) 02/05/2021  ? ? ?  04/07/2022  ?  1:35 PM 04/07/2022  ?  1:10 PM 03/07/2022  ?  8:36 AM  ?Depression screen PHQ 2/9  ?Decreased Interest 2 2 3   ?Down, Depressed, Hopeless 2 2 3   ?PHQ - 2 Score 4 4 6   ?Altered sleeping 3  3  ?Tired, decreased energy 3  3  ?Change in appetite 3  3  ?Feeling bad or failure about yourself  2  3  ?Trouble concentrating 2  2  ?Moving slowly or fidgety/restless 2  2  ?Suicidal thoughts 2  1  ?PHQ-9 Score 21  23  ?Difficult doing work/chores Very difficult  Very difficult  ? ? ?  04/07/2022  ?  1:35 PM  03/07/2022  ?  8:37 AM 04/03/2021  ?  1:53 PM 01/12/2019  ?  3:21 PM  ?GAD 7 : Generalized Anxiety Score  ?Nervous, Anxious, on Edge 2 3 3 1   ?Control/stop worrying 3 3 3 1   ?Worry too much - different things 3 3 3 1   ?Trouble relaxing 3 3 3 1   ?Restless 2 3 2 2   ?Easily annoyed or irritable 2 3 3 1   ?Afraid - awful might happen 3 3 3 1   ?Total GAD 7 Score 18 21 20 8   ?Anxiety Difficulty Very difficult Very difficult Very difficult   ?   ?Assessment & Plan:  ? ?1. Depression with anxiety ? Misty Ayers continues to have severe depression. I will increase her sertraline to 150 mg daily. I recommend she continue to stay out of work. I will try again to refer her for counseling. I iwll plan to see her back in 2 weeks for reassessment. ? ?- sertraline (ZOLOFT) 50 MG tablet; Take 3 tablets (150 mg total) by mouth daily.  Dispense: 90 tablet; Refill: 2 ?- Ambulatory referral to Psychology ? ?Return in about 2 weeks (around 04/21/2022) for Reassessment.  ? ?03/13/2019, MD ?

## 2022-04-14 ENCOUNTER — Encounter: Payer: Self-pay | Admitting: Family Medicine

## 2022-04-14 ENCOUNTER — Telehealth: Payer: Self-pay

## 2022-04-14 NOTE — Telephone Encounter (Signed)
Pt stated she was returning a call from [place] concerning a letter to be mailed or picked up. ?Pt would like to pick this up after 3pm today. ? ? ?I wasn't sure how to add this to the patient messages ? ?Marylene Land ?

## 2022-04-14 NOTE — Telephone Encounter (Signed)
Letter placed up front for pick up. Dm/cma  

## 2022-04-18 ENCOUNTER — Ambulatory Visit: Payer: 59 | Admitting: Family Medicine

## 2022-04-21 ENCOUNTER — Ambulatory Visit: Payer: 59 | Admitting: Family Medicine

## 2022-04-25 ENCOUNTER — Ambulatory Visit (INDEPENDENT_AMBULATORY_CARE_PROVIDER_SITE_OTHER): Payer: 59 | Admitting: Family Medicine

## 2022-04-25 VITALS — BP 130/78 | HR 110 | Temp 97.0°F | Ht 62.0 in | Wt 151.0 lb

## 2022-04-25 DIAGNOSIS — F418 Other specified anxiety disorders: Secondary | ICD-10-CM

## 2022-04-25 MED ORDER — SERTRALINE HCL 50 MG PO TABS: 150.0000 mg | ORAL_TABLET | Freq: Every day | ORAL | 2 refills | Status: DC

## 2022-04-25 NOTE — Progress Notes (Signed)
[facility] [address] [place] [address] Dept: [phone] Dept Fax: [phone]  Office Visit  Subjective:    Patient ID: Misty Ayers, female    DOB: Apr 25, 1991, 31 y.o..   MRN: [id number]  Chief Complaint  Patient presents with   Follow-up    2 week f/u.  Feeling a little bit better    History of Present Illness:  Patient is in today for reassessment of her severe depression. At her last visit, two weeks ago, Misty Ayers was having severe depression with increased anxiety and some suicidal ideation. I increased her sertraline to 150 mg, referred her for counseling, and we agreed on her seeking immediate crisis care if her suicidal thoughts progressed. She notes today, she is feeling much better. She has returned to work, noting she has been able to set some mental boundaries related to that work. She does phone triage work as a Engineer, civil (consulting) currently. She is still picking at the skin on her thighs at times, but this seems less. She has noted improved sleep.   Past Medical History: Patient Active Problem List   Diagnosis Date Noted   Depression, recurrent (HCC) 08/30/2021   Attention deficit hyperactivity disorder (ADHD) 01/12/2019   Past Surgical History:  Procedure Laterality Date   LEEP     Family History  Problem Relation Age of Onset   Kidney disease Mother    Stroke Mother    Hyperlipidemia Mother    Hypertension Mother    Mental illness Mother    Hypertension Father    Stroke Brother    Heart attack Maternal Grandfather    Outpatient Medications Prior to Visit  Medication Sig Dispense Refill   amphetamine-dextroamphetamine (ADDERALL) 20 MG tablet Take 1 tablet (20 mg total) by mouth daily. 30 tablet 0   ibuprofen (ADVIL) 600 MG tablet Take 1 tablet (600 mg total) by mouth every 6 (six) hours. 30 tablet 0   nystatin cream (MYCOSTATIN) Apply topically 4 (four) times daily.     Prenatal Vit-Fe Fumarate-FA  (PRENATAL MULTIVITAMIN) TABS tablet Take 1 tablet by mouth daily at 12 noon.     sertraline (ZOLOFT) 50 MG tablet Take 3 tablets (150 mg total) by mouth daily. 90 tablet 2   No facility-administered medications prior to visit.   Allergies  Allergen Reactions   Penicillins Rash    Objective:   Today's Vitals   04/25/22 1552  BP: 130/78  Pulse: (!) 110  Temp: (!) 97 F (36.1 C)  TempSrc: Temporal  SpO2: 98%  Weight: 151 lb (68.5 kg)  Height: 5\' 2"  (1.575 m)   Body mass index is 27.62 kg/m.   General: Well developed, well nourished. No acute distress. Skin: Warm and dry. Multiple scars on right upper thigh. No sign of infection. Psych: Alert and oriented. Normal mood and affect.  Health Maintenance Due  Topic Date Due   Hepatitis C Screening  Never done   COVID-19 Vaccine (4 - Booster for Pfizer series) 02/05/2021     Assessment & Plan:   1. Depression with anxiety Misty Ayers appears much more stable today. She was unable to get in with the counseling center I referred her to. We are looking at other options for her regarding counseling. I will plan to continue her on sertraline 150 mg. I will reassess her in 4 weeks (6 weeks at current dose).  - sertraline (ZOLOFT) 50 MG tablet; Take 3 tablets (150 mg total) by mouth daily.  Dispense: 90 tablet; Refill: 2   Return in about 4 weeks (around 05/23/2022) for Reassessment.   Loyola Mast, MD

## 2022-05-13 ENCOUNTER — Telehealth: Payer: Self-pay | Admitting: Family Medicine

## 2022-05-13 NOTE — Telephone Encounter (Signed)
Patients husband, Vilinda Blanks, given the information to call [facility] at [phone].  He will make sure patient gets the information. No further questions. Dm/cma

## 2022-05-13 NOTE — Telephone Encounter (Signed)
Lft VM to retn call at both husbands # and patients #. Dm/cma

## 2022-05-13 NOTE — Telephone Encounter (Signed)
Pt husband called and stated that his wife was waiting 2-3 weeks for a response a bout a referral but never received a call back yet

## 2022-05-23 ENCOUNTER — Ambulatory Visit: Payer: 59 | Admitting: Family Medicine

## 2022-05-29 ENCOUNTER — Ambulatory Visit (INDEPENDENT_AMBULATORY_CARE_PROVIDER_SITE_OTHER): Payer: 59 | Admitting: Family Medicine

## 2022-05-29 VITALS — BP 118/70 | HR 94 | Temp 97.6°F | Ht 62.0 in | Wt 150.0 lb

## 2022-05-29 DIAGNOSIS — F909 Attention-deficit hyperactivity disorder, unspecified type: Secondary | ICD-10-CM

## 2022-05-29 DIAGNOSIS — F424 Excoriation (skin-picking) disorder: Secondary | ICD-10-CM | POA: Diagnosis not present

## 2022-05-29 DIAGNOSIS — F339 Major depressive disorder, recurrent, unspecified: Secondary | ICD-10-CM | POA: Diagnosis not present

## 2022-05-29 MED ORDER — SERTRALINE HCL 100 MG PO TABS: 200.0000 mg | ORAL_TABLET | Freq: Every day | ORAL | 3 refills | Status: DC

## 2022-05-29 NOTE — Progress Notes (Signed)
[facility] [address] [place] [address] Dept: [phone] Dept Fax: [phone]  Chronic Care Office Visit  Subjective:    Patient ID: Misty Ayers, female    DOB: [birth year], 31 y.o..   MRN: [id number]  Chief Complaint  Patient presents with   Follow-up    4 week f/u.      History of Present Illness:  Patient is in today for reassessment of her severe depression. In early May, Misty Ayers was having severe depression with increased anxiety and some suicidal ideation. I increased her sertraline to 150 mg, referred her for counseling, and we agreed on her seeking immediate crisis care if her suicidal thoughts progressed. She did improve over the next few weeks, so we have continued her at the 150 mg dose. She notes that things are stable, but have not continued to get better. She had seen her GYN recently and the issue of her picking at the skin on her thighs came up. Apparently, she had also picked a raw spot in her umbilicus. Her GYN started her on some PRN clonazepam 0.5 mg, which she has been taking nightly. She feels that this has helped.   Misty Ayers has a history of ADHD. She had been on Adderall for this earlier in the year. She had stopped this back in late April/May. She notes she did take some three times last week, as she had trouble focusing on completing tasks at home. 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 She would like to go back on Adderall during this time to improve her studying.  Past Medical History: Patient Active Problem List   Diagnosis Date Noted   Compulsive skin picking 05/29/2022   Depression, recurrent (HCC) 08/30/2021   Attention deficit hyperactivity disorder (ADHD) 01/12/2019   Past Surgical History:  Procedure Laterality Date   LEEP     Family History  Problem Relation Age of  Onset   Kidney disease Mother    Stroke Mother    Hyperlipidemia Mother    Hypertension Mother    Mental illness Mother    Hypertension Father    Stroke Brother    Heart attack Maternal Grandfather    Outpatient Medications Prior to Visit  Medication Sig Dispense Refill   clonazePAM (KLONOPIN) 0.5 MG tablet Take 0.5 mg by mouth 2 (two) times daily as needed.     amphetamine-dextroamphetamine (ADDERALL) 20 MG tablet Take 1 tablet (20 mg total) by mouth daily. 30 tablet 0   sertraline (ZOLOFT) 50 MG tablet Take 3 tablets (150 mg total) by mouth daily. 90 tablet 2   nystatin cream (MYCOSTATIN) Apply topically 4 (four) times daily. (Patient not taking: Reported on 05/29/2022)     ibuprofen (ADVIL) 600 MG tablet Take 1 tablet (600 mg total) by mouth every 6 (six) hours. 30 tablet 0   Prenatal Vit-Fe Fumarate-FA (PRENATAL MULTIVITAMIN) TABS tablet Take 1 tablet by mouth daily at 12 noon.     No facility-administered medications prior to visit.   Allergies  Allergen Reactions   Penicillins Rash     Objective:   Today's Vitals   05/29/22 0807  BP: 118/70  Pulse: 94  Temp: 97.6 F (36.4 C)  TempSrc: Temporal  SpO2: 97%  Weight: 150 lb (68 kg)  Height: 5\' 2"  (1.575 m)   Body mass index is 27.44 kg/m.  General: Well developed, well nourished. No acute distress. Psych: Alert and oriented. Normal mood and affect.  Health Maintenance Due  Topic Date Due   Hepatitis C Screening  Never done   COVID-19 Vaccine (4 - Booster for Pfizer series) 02/05/2021     Assessment & Plan:   1. Depression, recurrent (HCC) I recommend we increase her sertraline dose to 200 mg daily. I will reassess her in 6 weeks to see if she responds. I once again encouraged her to seek counseling. Part of her avoidance of this has been a feeling of being overwhelmed. she also notes how social interactions can leave her feeling drained and needing to go to sleep. I discussed that this may be a defense mechanism  to deal with buried emotions that are close to surfacing after such interactions, which resonated with her. As her depression improves, this should also decrease.  - sertraline (ZOLOFT) 100 MG tablet; Take 2 tablets (200 mg total) by mouth daily.  Dispense: 60 tablet; Refill: 3  2. Attention deficit hyperactivity disorder (ADHD), unspecified ADHD type I advised Misty Ayers to not be taking Adderall. I am concerned that this may be driving some of her anxiety and contributing to nervous picking at her skin. Her husband pointed out how her skin picking seemed to flare last week concurrent with her Adderall use. With her planned return to studying. I encouraged her to create a distraction-free study space to help improve her concentration and focus.  3. Compulsive skin picking I am okay with her use of clonazepam, but cautioned her about avoiding excessive use.  Return in about 6 weeks (around 07/10/2022) for Reassessment.   Loyola Mast, MD

## 2022-06-05 ENCOUNTER — Encounter: Payer: Self-pay | Admitting: Family Medicine

## 2022-06-22 ENCOUNTER — Ambulatory Visit (INDEPENDENT_AMBULATORY_CARE_PROVIDER_SITE_OTHER): Payer: 59

## 2022-06-22 ENCOUNTER — Ambulatory Visit
Admission: EM | Admit: 2022-06-22 | Discharge: 2022-06-22 | Disposition: A | Discharge status: Home / Self Care | Payer: 59 | Attending: Emergency Medicine | Admitting: Emergency Medicine

## 2022-06-22 DIAGNOSIS — S99921A Unspecified injury of right foot, initial encounter: Secondary | ICD-10-CM | POA: Diagnosis not present

## 2022-06-22 DIAGNOSIS — R2689 Other abnormalities of gait and mobility: Secondary | ICD-10-CM

## 2022-06-22 DIAGNOSIS — W19XXXA Unspecified fall, initial encounter: Secondary | ICD-10-CM | POA: Diagnosis not present

## 2022-06-22 DIAGNOSIS — S99922A Unspecified injury of left foot, initial encounter: Secondary | ICD-10-CM

## 2022-06-22 DIAGNOSIS — M79671 Pain in right foot: Secondary | ICD-10-CM | POA: Diagnosis not present

## 2022-06-22 DIAGNOSIS — M25571 Pain in right ankle and joints of right foot: Secondary | ICD-10-CM | POA: Diagnosis not present

## 2022-06-22 NOTE — ED Provider Notes (Addendum)
[facility]    CSN: [id number] Arrival date & time: 06/22/22  1548    HISTORY   Chief Complaint  Patient presents with   Foot Pain   HPI Misty Ayers is a pleasant, 31 y.o. female who presents to urgent care today. Patient complains of pain and swelling of status of her left foot after hitting her foot against concrete and falling on her toes of the pool.  Patient states this happened yesterday.  Patient states she is unable to bear weight on her left foot at all secondary to pain.  Unclear why patient has waited a day and a half to have this evaluated.  Patient is requesting x-ray of her foot.  Patient states she is currently breast-feeding.  The history is provided by the patient.   Past Medical History:  Diagnosis Date   ADHD    Anemia    Anxiety    Depression    Patient Active Problem List   Diagnosis Date Noted   Compulsive skin picking 05/29/2022   Depression, recurrent (HCC) 08/30/2021   Attention deficit hyperactivity disorder (ADHD) 01/12/2019   Past Surgical History:  Procedure Laterality Date   LEEP     OB History     Gravida  2   Para  1   Term  0   Preterm  1   AB  1   Living  1      SAB  1   IAB  0   Ectopic  0   Multiple  0   Live Births  1          Home Medications    Prior to Admission medications   Medication Sig Start Date End Date Taking? Authorizing Provider  sertraline (ZOLOFT) 100 MG tablet Take 2 tablets (200 mg total) by mouth daily. 05/29/22   Loyola Mast, MD    Family History Family History  Problem Relation Age of Onset   Kidney disease Mother    Stroke Mother    Hyperlipidemia Mother    Hypertension Mother    Mental illness Mother    Hypertension Father    Stroke Brother    Heart attack Maternal Grandfather    Social History Social History   Tobacco Use   Smoking status: Never   Smokeless tobacco: Never  Vaping Use   Vaping Use: Never used  Substance Use Topics   Alcohol  use: Not Currently    Comment: 3-5   Drug use: No   Allergies   Penicillins  Review of Systems Review of Systems Pertinent findings revealed after performing a 14 point review of systems has been noted in the history of present illness.  Physical Exam Triage Vital Signs ED Triage Vitals  Enc Vitals Group     BP 10/04/21 0827 (!) 147/82     Pulse Rate 10/04/21 0827 72     Resp 10/04/21 0827 18     Temp 10/04/21 0827 98.3 F (36.8 C)     Temp Source 10/04/21 0827 Oral     SpO2 10/04/21 0827 98 %     Weight --      Height --      Head Circumference --      Peak Flow --      Pain Score 10/04/21 0826 5     Pain Loc --      Pain Edu? --      Excl. in GC? --    Updated Vital Signs BP  138/89 (BP Location: Right Arm)   Pulse 97   Temp 98.8 F (37.1 C) (Oral)   Resp 16   LMP 06/06/2022   SpO2 98%   Breastfeeding Yes   Physical Exam Vitals and nursing note reviewed.  Constitutional:      General: She is not in acute distress.    Appearance: Normal appearance.  HENT:     Head: Normocephalic and atraumatic.  Eyes:     Pupils: Pupils are equal, round, and reactive to light.  Cardiovascular:     Rate and Rhythm: Normal rate and regular rhythm.  Pulmonary:     Effort: Pulmonary effort is normal.     Breath sounds: Normal breath sounds.  Musculoskeletal:        General: Swelling, tenderness and signs of injury present. Normal range of motion.     Cervical back: Normal range of motion and neck supple.  Skin:    General: Skin is warm and dry.  Neurological:     General: No focal deficit present.     Mental Status: She is alert and oriented to person, place, and time. Mental status is at baseline.  Psychiatric:        Mood and Affect: Mood normal.        Behavior: Behavior normal.        Thought Content: Thought content normal.        Judgment: Judgment normal.     UC Couse / Diagnostics / Procedures:     Radiology No results found.  Procedures Procedures  (including critical care time) EKG  Pending results:  Labs Reviewed - No data to display  Medications Ordered in UC: Medications - No data to display  UC Diagnoses / Final Clinical Impressions(s)   I have reviewed the triage vital signs and the nursing notes.  Pertinent labs & imaging results that were available during my care of the patient were reviewed by me and considered in my medical decision making (see chart for details).    Final diagnoses:  Injury of left foot, initial encounter  Unable to bear weight on left lower extremity   X-ray performed at patient's request.  Patient was advised to follow-up with [facility] for treatment as needed.   ED Prescriptions   None    PDMP not reviewed this encounter.  Discharge Instructions:   Discharge Instructions      The results of your x-ray will be posted to your MyChart account once they are complete.  Please avoid bearing weight on your right foot until you have the results of your x-ray.  You can continue using ibuprofen as needed for pain.  I recommend that you keep your foot elevated and ice it is much as possible.    Disposition Upon Discharge:  Condition: stable for discharge home Home: take medications as prescribed; routine discharge instructions as discussed; follow up as advised.  Patient presented with an acute illness with associated systemic symptoms and significant discomfort requiring urgent management. In my opinion, this is a condition that a prudent lay person (someone who possesses an average knowledge of health and medicine) may potentially expect to result in complications if not addressed urgently such as respiratory distress, impairment of bodily function or dysfunction of bodily organs.   Routine symptom specific, illness specific and/or disease specific instructions were discussed with the patient and/or caregiver at length.   As such, the patient has been evaluated and assessed, work-up  was performed and treatment was provided in alignment  with urgent care protocols and evidence based medicine.  Patient/parent/caregiver has been advised that the patient may require follow up for further testing and treatment if the symptoms continue in spite of treatment, as clinically indicated and appropriate.  Patient/parent/caregiver has been advised to report to orthopedic urgent care clinic or return to the [facility] or PCP in 3-5 days if no better; follow-up with orthopedics, PCP or the Emergency Department if new signs and symptoms develop or if the current signs or symptoms continue to change or worsen for further workup, evaluation and treatment as clinically indicated and appropriate  The patient will follow up with their current PCP if and as advised. If the patient does not currently have a PCP we will have assisted them in obtaining one.   The patient may need specialty follow up if the symptoms continue, in spite of conservative treatment and management, for further workup, evaluation, consultation and treatment as clinically indicated and appropriate.  Patient/parent/caregiver verbalized understanding and agreement of plan as discussed.  All questions were addressed during visit.  Please see discharge instructions below for further details of plan.  This office note has been dictated using Teaching laboratory technician.  Unfortunately, this method of dictation can sometimes lead to typographical or grammatical errors.  I apologize for your inconvenience in advance if this occurs.  Please do not hesitate to reach out to me if clarification is needed.      Theadora Rama Scales, PA-C 06/23/22 0853    Theadora Rama Scales, PA-C 08/05/22 1705

## 2022-06-22 NOTE — ED Triage Notes (Addendum)
The pt has pain and swelling to the last two toes on her left foot. She states she hit her foot against the concrete and a rock fell on the toes at the pool.  Started: yesterday    The patient states she is breastfeeding.

## 2022-06-22 NOTE — Discharge Instructions (Addendum)
The results of your x-ray will be posted to your MyChart account once they are complete.  Please avoid bearing weight on your right foot until you have the results of your x-ray.  You can continue using ibuprofen as needed for pain.  I recommend that you keep your foot elevated and ice it is much as possible.

## 2022-06-23 ENCOUNTER — Other Ambulatory Visit: Payer: Self-pay | Admitting: Family Medicine

## 2022-06-23 DIAGNOSIS — F339 Major depressive disorder, recurrent, unspecified: Secondary | ICD-10-CM

## 2022-07-08 IMAGING — US US OB < 14 WEEKS - US OB TV
1 series · 15 of 28 positions shown · non-contrast
Comparison: None.

CLINICAL DATA: Vaginal bleeding. Estimated gestational age by last
menstrual period equals 7 weeks 2 days.

EXAM:
OBSTETRIC <14 WK US AND TRANSVAGINAL OB US
TECHNIQUE: Both transabdominal and transvaginal ultrasound examinations were
performed for complete evaluation of the gestation as well as the
maternal uterus, adnexal regions, and pelvic cul-de-sac.
Transvaginal technique was performed to assess early pregnancy.

[Series 1: us ob < 14 weeks - us ob tv · 47 acquisitions, 15 frames shown]
[im 1/47]
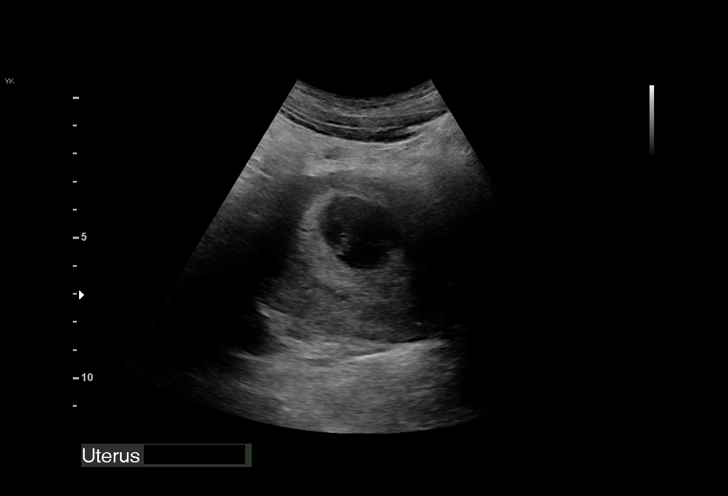
[im 4/47]
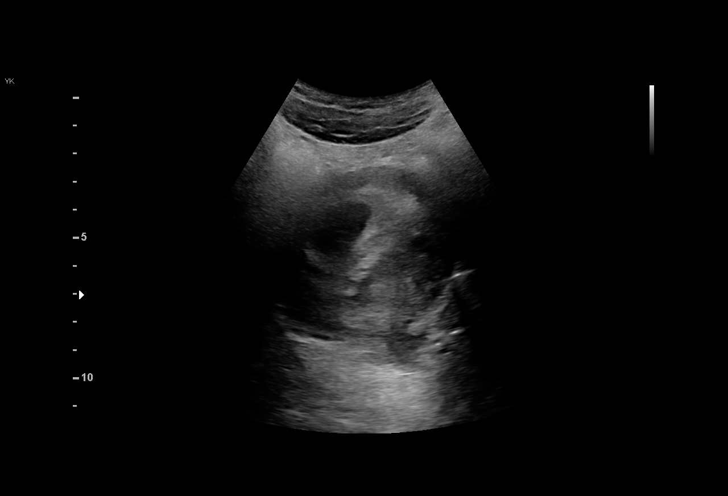
[im 7/47]
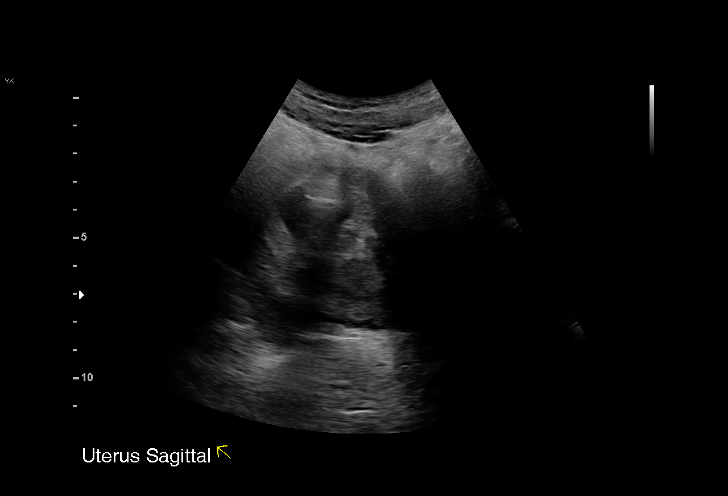
[im 11/47]
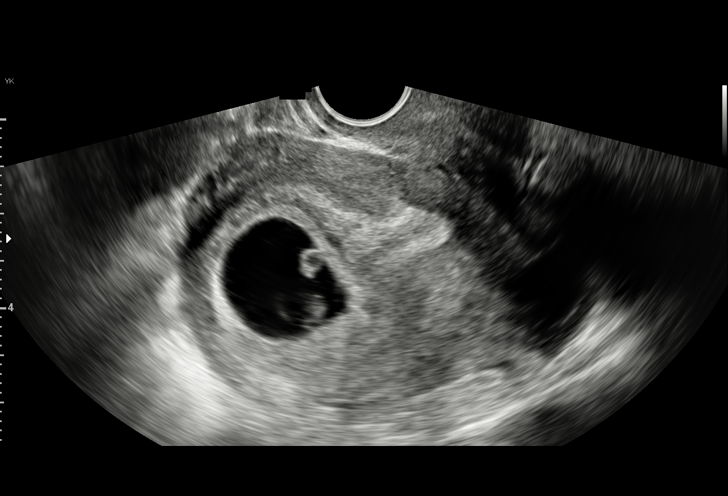
[im 14/47]
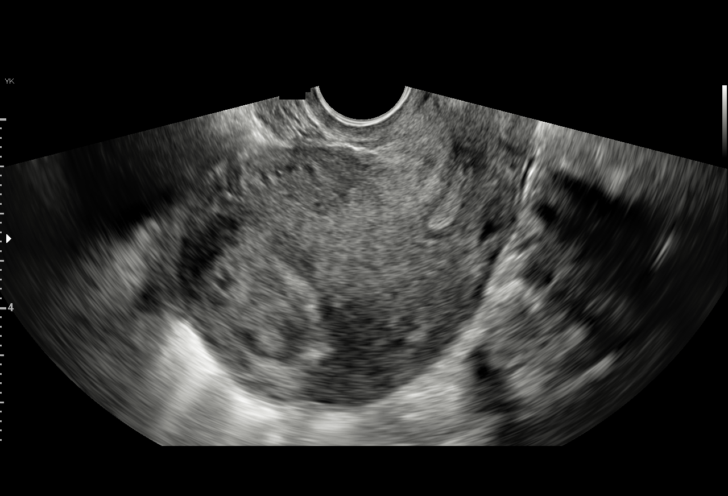
[im 18/47]
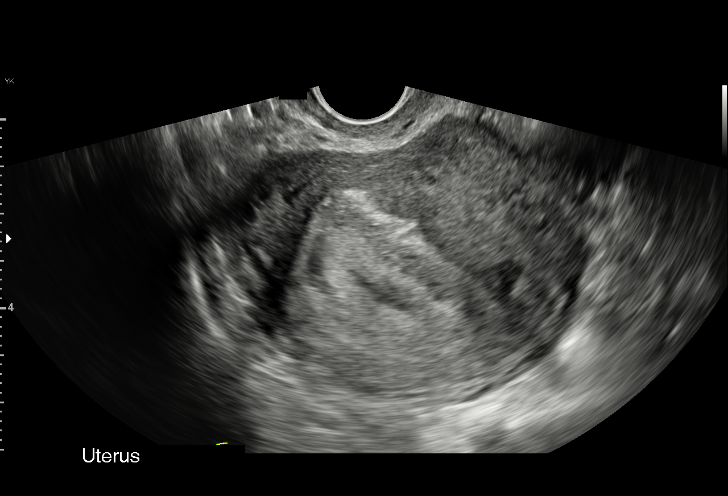
[im 21/47]
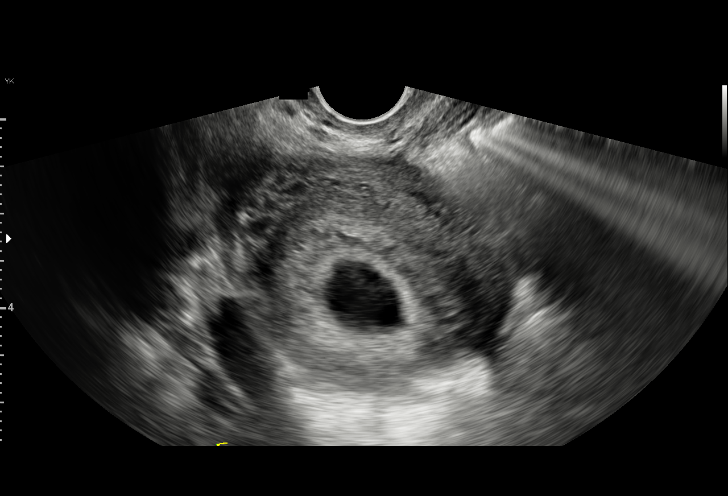
[im 24/47]
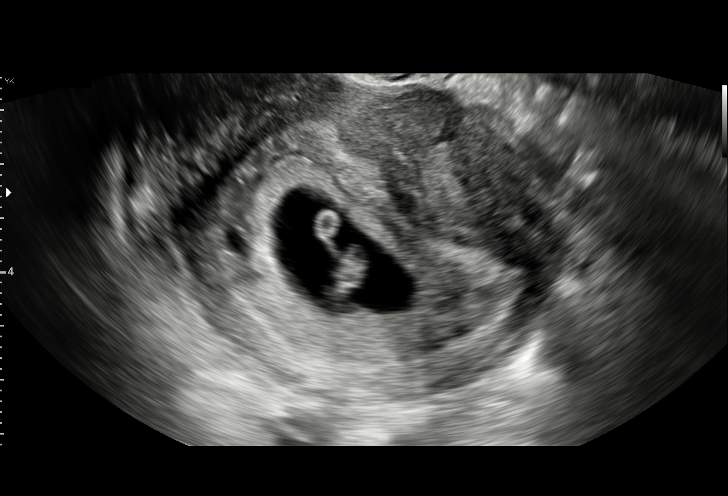
[im 26/47]
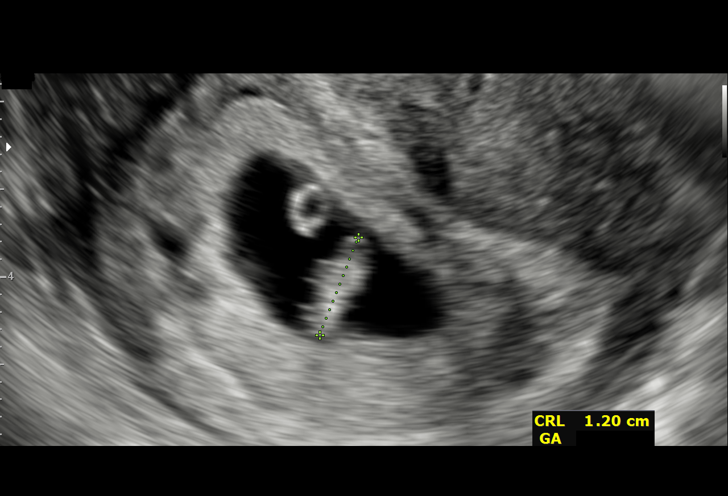
[im 29/47]
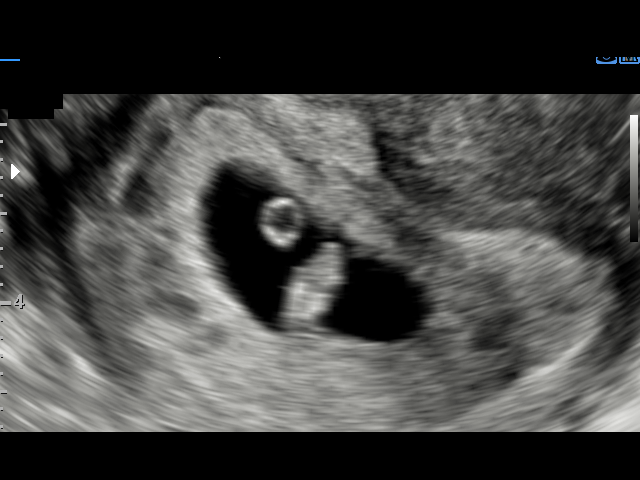
[im 33/47]
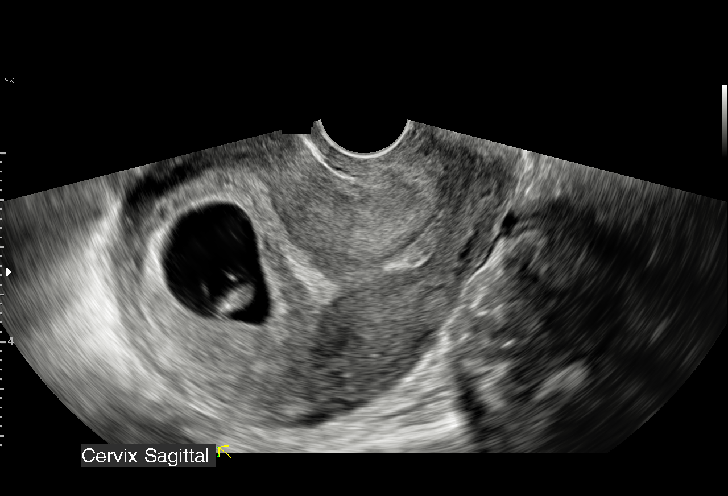
[im 36/47]
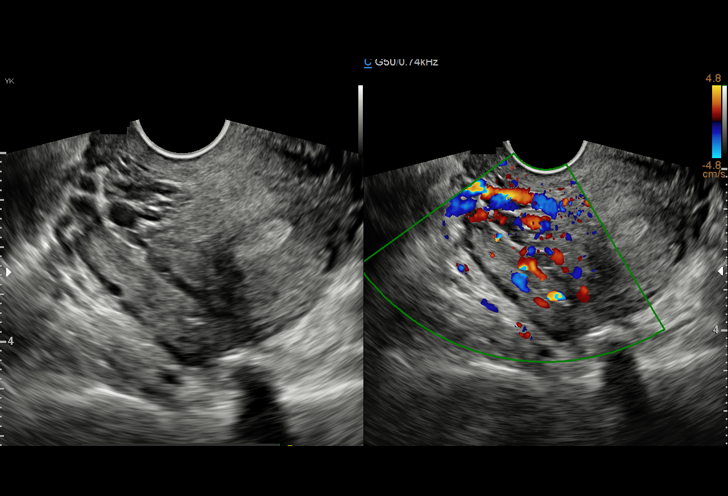
[im 40/47]
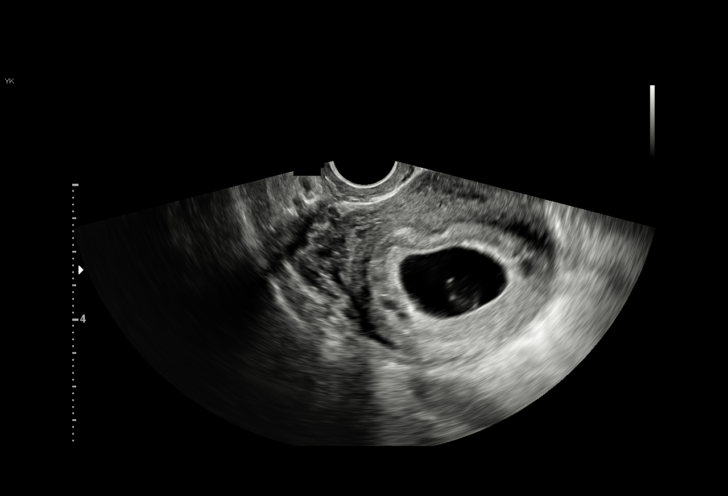
[im 43/47]
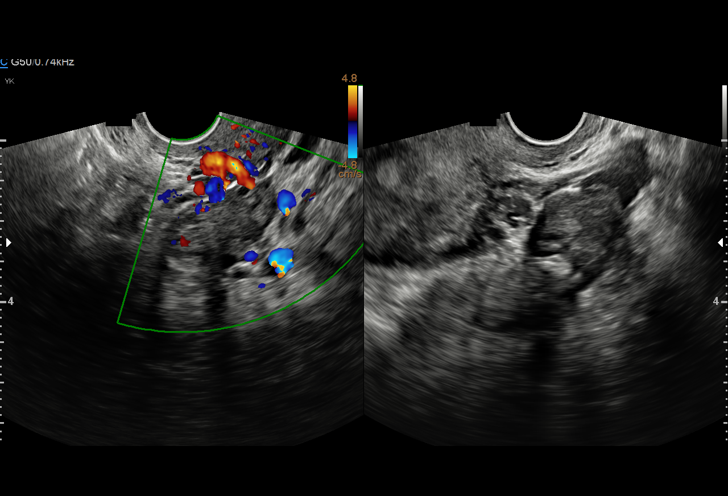
[im 47/47]
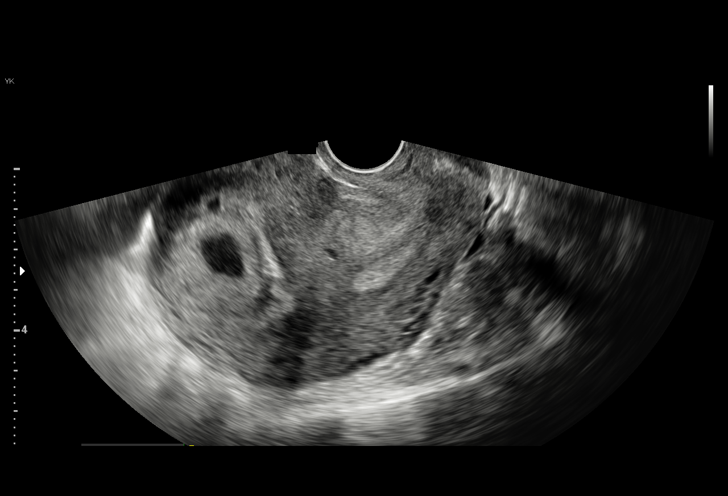

[15 of 28 positions shown; findings below may reference images not displayed]

FINDINGS: Intrauterine gestational sac: Present

Yolk sac:  Present

Embryo:  Present

Cardiac Activity: Present

Heart Rate: 154 bpm

CRL:  11.7 mm   7 w   2 d                  US EDC: 04/05/2021

Subchorionic hemorrhage:  None visualized.

Maternal uterus/adnexae: Normal uterus and ovaries. Corpus luteal
cyst in the LEFT ovary.
IMPRESSION: 1. Single intrauterine gestation with embryo and normal cardiac
activity.

2. Estimated gestational age by crown rump length equals 7 weeks 2
days.

## 2022-07-10 ENCOUNTER — Encounter: Payer: Self-pay | Admitting: Family Medicine

## 2022-07-10 ENCOUNTER — Telehealth: Payer: Self-pay

## 2022-07-10 ENCOUNTER — Ambulatory Visit (INDEPENDENT_AMBULATORY_CARE_PROVIDER_SITE_OTHER): Payer: 59 | Admitting: Family Medicine

## 2022-07-10 ENCOUNTER — Ambulatory Visit (INDEPENDENT_AMBULATORY_CARE_PROVIDER_SITE_OTHER): Payer: 59

## 2022-07-10 VITALS — BP 118/72 | HR 120 | Temp 97.3°F | Ht 62.0 in | Wt 151.6 lb

## 2022-07-10 DIAGNOSIS — F339 Major depressive disorder, recurrent, unspecified: Secondary | ICD-10-CM | POA: Diagnosis not present

## 2022-07-10 DIAGNOSIS — M79675 Pain in left toe(s): Secondary | ICD-10-CM

## 2022-07-10 NOTE — Progress Notes (Unsigned)
Follow up visit from [facility] on 06/22/22. Left foot pain after after hitting her foot against concrete and falling on her toes of the pool.   Pt is still having persistent pain with restricted rom in 5th toe

## 2022-07-10 NOTE — Telephone Encounter (Signed)
X ray laterality corrected

## 2022-07-10 NOTE — Progress Notes (Signed)
[facility] [address] [place] [address] Dept: [phone] Dept Fax: [phone]  Office Visit  Subjective:    Patient ID: Misty Ayers, female    DOB: [birth year], 31 y.o..   MRN: [id number]  Chief Complaint  Patient presents with   Follow-up    6 week f/u.  C/o pain in the pinkie toe.     History of Present Illness:  Patient is in today for reassessment of her severe depression. In early May, Misty Ayers was having severe depression with increased anxiety and some suicidal ideation. She has been on sertraline for some time now. We had tried increasing her dosage to try and improve her depressive symptoms. She had seen her GYN who identified an issue with her picking at the skin on her thighs. Her GYN started her on some PRN clonazepam 0.5 mg. Misty Ayers notes that she felt she was having side effects from her medication. We had discussed her tapering back off of her higher dose of sertraline. Misty Ayers continued to taper the medication and is now off of this entirely.  Misty Ayers feels she is doing much better at this point. She notes that her mood is improved. She is sleeping better. she is no longer having the issue with skin picking. She did have a single panic episode this past week, but was able to identify her trigger and work through this without taking any of the clonazepam. 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  Misty Ayers also notes she does have some ongoing left 5th toe pain. She injured the toe 2 weeks ago by accidentally kicking a rock. She was seen in Urgent Care. Her x-ray was apparently normal. They placed her in a walking boot, which she has transitioned out of. She notes she continues to have some pain and difficulty with plantar flexion of the  5th toe.  Past Medical History: Patient Active Problem List   Diagnosis Date Noted   Compulsive skin picking 05/29/2022   Depression, recurrent (HCC) 08/30/2021   Attention deficit hyperactivity disorder (ADHD) 01/12/2019   Past Surgical History:  Procedure Laterality Date   LEEP     Family History  Problem Relation Age of Onset   Kidney disease Mother    Stroke Mother    Hyperlipidemia Mother    Hypertension Mother    Mental illness Mother    Hypertension Father    Stroke Brother    Heart attack Maternal Grandfather    Outpatient Medications Prior to Visit  Medication Sig Dispense Refill   sertraline (ZOLOFT) 100 MG tablet Take 2 tablets (200 mg total) by mouth daily. 60 tablet 3   No facility-administered medications prior to visit.   Allergies  Allergen Reactions   Penicillins Rash     Objective:   Today's Vitals   07/10/22 0814  BP: 118/72  Pulse: (!) 120  Temp: (!) 97.3 F (36.3 C)  TempSrc: Temporal  SpO2: 94%  Weight: 151 lb 9.6 oz (68.8 kg)  Height: 5\' 2"  (1.575 m)   Body mass index is 27.73 kg/m.   General: Well developed, well nourished. No acute distress. Foot: Tenderness over the left toe. No burising. +/- mild swelling. Psych: Alert and oriented. Normal mood and much brighter affect.  Health Maintenance Due  Topic Date Due   Hepatitis C Screening  Never done   INFLUENZA VACCINE  07/08/2022   Imaging: Left foot x-ray- No fracture or dislocation.       07/10/2022    8:28 AM 07/10/2022    8:27 AM 04/07/2022    1:35 PM  Depression screen PHQ 2/9  Decreased Interest 0 0 2  Down, Depressed, Hopeless 1 1 2   PHQ - 2 Score 1 1 4   Altered sleeping 1 1 3   Tired, decreased energy 0 0 3  Change in appetite 0 0 3  Feeling bad or failure about yourself  1 1 2   Trouble concentrating 2 2 2   Moving slowly or fidgety/restless 0 0 2  Suicidal thoughts 0 0 2  PHQ-9 Score 5 5 21   Difficult doing work/chores Not difficult at all Not difficult at all Very  difficult      07/10/2022    8:29 AM 04/07/2022    1:35 PM 03/07/2022    8:37 AM 04/03/2021    1:53 PM  GAD 7 : Generalized Anxiety Score  Nervous, Anxious, on Edge 1 2 3 3   Control/stop worrying 1 3 3 3   Worry too much - different things 0 3 3 3   Trouble relaxing 1 3 3 3   Restless 3 2 3 2   Easily annoyed or irritable 3 2 3 3   Afraid - awful might happen 3 3 3 3   Total GAD 7 Score 12 18 21 20   Anxiety Difficulty Not difficult at all Very difficult Very difficult Very difficult   Assessment & Plan:   1. Depression, recurrent [facility]) Misty Ayers does appear to be doing much better at this point. It may be that medication side effects were worsening her depressive symptoms. I am fine with 09/09/2022 following this off of her meds. We did review approaches to managing her anxiety attacks. I will plan to check in with her in 3 months.  2. Toe pain, left Reviewed UC note, prior x-rays and current x-ray. No sign of fracture. I recommend she use stiff-soled shoes. She should do warm soaks with ROM exercises until pain is resolved.  - DG Foot Complete Left   Return in about 3 months (around 10/10/2022) for Reassessment.   03/09/2022, MD

## 2022-07-10 NOTE — Telephone Encounter (Addendum)
Dr Veto Kemps noticed on 07/10/22 that on 06/22/22, the [facility] on [address] had ordered at [facility] foot xray and the left foot was x rayed and radiologist read the exam for the left foot.  I called [facility] to see if it could be corrected.I was told they could not change anything. A new order would have to be placed and then they could merge the images to the new correct order. I messaged Thereasa Solo, the [employer] who performed the x ray and she said she would let the ordering provider know.

## 2022-07-10 NOTE — Addendum Note (Signed)
Addended by: Ma Rings on: 07/10/2022 01:03 PM   Modules accepted: Orders

## 2022-07-13 ENCOUNTER — Encounter: Payer: Self-pay | Admitting: Family Medicine

## 2022-07-13 DIAGNOSIS — F909 Attention-deficit hyperactivity disorder, unspecified type: Secondary | ICD-10-CM

## 2022-07-15 MED ORDER — AMPHETAMINE-DEXTROAMPHETAMINE 10 MG PO TABS: 10.0000 mg | ORAL_TABLET | Freq: Every day | ORAL | 0 refills | Status: DC

## 2022-07-15 NOTE — Addendum Note (Signed)
Addended by: Loyola Mast on: 07/15/2022 08:35 AM   Modules accepted: Orders

## 2022-07-18 ENCOUNTER — Ambulatory Visit: Payer: 59 | Admitting: Family Medicine

## 2022-08-03 ENCOUNTER — Other Ambulatory Visit: Payer: Self-pay | Admitting: Family Medicine

## 2022-08-03 DIAGNOSIS — F418 Other specified anxiety disorders: Secondary | ICD-10-CM

## 2022-08-20 ENCOUNTER — Telehealth: Payer: Self-pay | Admitting: Family Medicine

## 2022-08-20 NOTE — Telephone Encounter (Signed)
[id number] Comments: Hey Dr. Veto Kemps, I am canceling this appt because I am no longer taking the Adderall at this time, I made an appt to check in via telehealth with you Monday November sixth -just an update I am currently pregnant & going to [facility] seeing Dorisann Frames

## 2022-08-24 ENCOUNTER — Inpatient Hospital Stay (HOSPITAL_COMMUNITY): Payer: 59

## 2022-08-24 ENCOUNTER — Inpatient Hospital Stay (HOSPITAL_COMMUNITY)
Admission: AD | Admit: 2022-08-24 | Discharge: 2022-08-24 | Disposition: A | Discharge status: Home / Self Care | Payer: 59 | Attending: Obstetrics and Gynecology | Admitting: Obstetrics and Gynecology

## 2022-08-24 ENCOUNTER — Encounter (HOSPITAL_COMMUNITY): Payer: Self-pay

## 2022-08-24 DIAGNOSIS — O208 Other hemorrhage in early pregnancy: Secondary | ICD-10-CM | POA: Insufficient documentation

## 2022-08-24 DIAGNOSIS — Z3A01 Less than 8 weeks gestation of pregnancy: Secondary | ICD-10-CM | POA: Insufficient documentation

## 2022-08-24 DIAGNOSIS — K5900 Constipation, unspecified: Secondary | ICD-10-CM | POA: Insufficient documentation

## 2022-08-24 DIAGNOSIS — O26891 Other specified pregnancy related conditions, first trimester: Secondary | ICD-10-CM | POA: Diagnosis present

## 2022-08-24 DIAGNOSIS — R109 Unspecified abdominal pain: Secondary | ICD-10-CM

## 2022-08-24 DIAGNOSIS — R1011 Right upper quadrant pain: Secondary | ICD-10-CM | POA: Insufficient documentation

## 2022-08-24 DIAGNOSIS — O99611 Diseases of the digestive system complicating pregnancy, first trimester: Secondary | ICD-10-CM | POA: Diagnosis not present

## 2022-08-24 DIAGNOSIS — R0602 Shortness of breath: Secondary | ICD-10-CM | POA: Insufficient documentation

## 2022-08-24 DIAGNOSIS — O26899 Other specified pregnancy related conditions, unspecified trimester: Secondary | ICD-10-CM

## 2022-08-24 LAB — COMPREHENSIVE METABOLIC PANEL
ALT: 13 U/L (ref 0–44)
AST: 21 U/L (ref 15–41)
Albumin: 4.3 g/dL (ref 3.5–5.0)
Alkaline Phosphatase: 57 U/L (ref 38–126)
Anion gap: 12 (ref 5–15)
BUN: <5 mg/dL — ABNORMAL LOW (ref 6–20)
CO2: 23 mmol/L (ref 22–32)
Calcium: 9.9 mg/dL (ref 8.9–10.3)
Chloride: 103 mmol/L (ref 98–111)
Creatinine, Ser: 0.72 mg/dL (ref 0.44–1.00)
GFR, Estimated: >60 mL/min (ref >60)
Glucose, Bld: 96 mg/dL (ref 70–99)
Potassium: 3.5 mmol/L (ref 3.5–5.1)
Sodium: 138 mmol/L (ref 135–145)
Total Bilirubin: 0.7 mg/dL (ref 0.3–1.2)
Total Protein: 7.5 g/dL (ref 6.5–8.1)

## 2022-08-24 LAB — CBC
HCT: 36.1 % (ref 36.0–46.0)
Hemoglobin: 12.1 g/dL (ref 12.0–15.0)
MCH: 28.1 pg (ref 26.0–34.0)
MCHC: 33.5 g/dL (ref 30.0–36.0)
MCV: 83.8 fL (ref 80.0–100.0)
Platelets: 303 10*3/uL (ref 150–400)
RBC: 4.31 MIL/uL (ref 3.87–5.11)
RDW: 12.4 % (ref 11.5–15.5)
WBC: 6.1 10*3/uL (ref 4.0–10.5)
nRBC: 0 % (ref 0.0–0.2)

## 2022-08-24 LAB — AMYLASE: Amylase: 89 U/L (ref 28–100)

## 2022-08-24 LAB — WET PREP, GENITAL
Sperm: NONE SEEN
Trich, Wet Prep: NONE SEEN
WBC, Wet Prep HPF POC: >=10 — AB (ref <10)
Yeast Wet Prep HPF POC: NONE SEEN

## 2022-08-24 LAB — HCG, QUANTITATIVE, PREGNANCY: hCG, Beta Chain, Quant, S: 88478 m[IU]/mL — ABNORMAL HIGH (ref <5)

## 2022-08-24 LAB — LIPASE, BLOOD: Lipase: 39 U/L (ref 11–51)

## 2022-08-24 LAB — HIV ANTIBODY (ROUTINE TESTING W REFLEX): HIV Screen 4th Generation wRfx: NONREACTIVE

## 2022-08-24 MED ORDER — LACTATED RINGERS IV SOLN: INTRAVENOUS | Status: DC

## 2022-08-24 MED ORDER — POLYETHYLENE GLYCOL 3350 17 GM/SCOOP PO POWD: 17.0000 g | Freq: Every day | ORAL | 2 refills | Status: DC | PRN

## 2022-08-24 NOTE — MAU Provider Note (Signed)
Chief Complaint: Shortness of Breath, Shoulder Pain, Abdominal Pain, and Headache   Event Date/Time   First Provider Initiated Contact with Patient 08/24/22 1326      SUBJECTIVE HPI: Misty Ayers is a 31 y.o. G2P0111 at 7.1 weeks by LMP who was sent to Maternity Admissions by her Midwife because she called the office reporting severe abd pain radiating to her right shoulder today and SOB. CNM concerned for ectopic pregnancy. Pt had blood work in office w/ nml rise in [place] but has not had [place] this pregnancy.   Vaginal Bleeding: denies  Passage of tissue or clots: Denies Dizziness: Denies  Thanks she might have a UTI.   O POS  Pain Location: RUQ Quality: Feels like  Severity: 6/10 on pain scale Duration: Since this morning Course: Unchanged Context: Early pregnancy. IUP not confirmed. No cardiovascular health conditions Timing: constant Modifying factors: None. Hasn't tried anything for the pain Associated signs and symptoms: Neg for fever, chills, N/V/D/C, hematuria, vaginal bleeding,vaginal discharge, cough, chest pain. Pos for dysuria, SOB. Has had rare nausea, vomiting and diarrhea a few weeks ago.   Past Medical History:  Diagnosis Date  . ADHD   . Anemia   . Anxiety   . Depression    OB History  Gravida Para Term Preterm AB Living  3 1 0 1 1 1   SAB IAB Ectopic Multiple Live Births  1 0 0 0 1    # Outcome Date GA Lbr Len/2nd Weight Sex Delivery Anes PTL Lv  3 Current           2 Preterm 03/14/21 [redacted]w[redacted]d / 01:09 2220 g F Vag-Spont None  LIV     Birth Comments: wnl  1 SAB            Past Surgical History:  Procedure Laterality Date  . LEEP     Social History   Socioeconomic History  . Marital status: Married    Spouse name: Not on file  . Number of children: 1  . Years of education: college  . Highest education level: Not on file  Occupational History  . Occupation: [redacted]w[redacted]d    Comment: [employer]  Tobacco Use  . Smoking status: Never  .  Smokeless tobacco: Never  Vaping Use  . Vaping Use: Never used  Substance and Sexual Activity  . Alcohol use: Not Currently    Comment: 3-5  . Drug use: No  . Sexual activity: Yes  Other Topics Concern  . Not on file  Social History Narrative  . Not on file   Social Determinants of Health   Financial Resource Strain: Not on file  Food Insecurity: Not on file  Transportation Needs: Not on file  Physical Activity: Not on file  Stress: Not on file  Social Connections: Not on file  Intimate Partner Violence: Not on file   No current facility-administered medications on file prior to encounter.   Current Outpatient Medications on File Prior to Encounter  Medication Sig Dispense Refill  . amphetamine-dextroamphetamine (ADDERALL) 10 MG tablet Take 1 tablet (10 mg total) by mouth daily with breakfast. 30 tablet 0   Allergies  Allergen Reactions  . Penicillins Rash    I have reviewed the past Medical Hx, Surgical Hx, Social Hx, Allergies and Medications.   Review of Systems  Constitutional:  Negative for chills and fever.  Respiratory:  Positive for shortness of breath. Negative for cough and wheezing.   Gastrointestinal:  Positive for abdominal pain. Negative  for constipation, diarrhea, nausea and vomiting.  Genitourinary:  Positive for dysuria. Negative for hematuria, urgency, vaginal bleeding and vaginal discharge.    OBJECTIVE Patient Vitals for the past 24 hrs:  BP Temp Temp src Pulse Resp SpO2 Height Weight  08/24/22 1319 128/72 -- -- 93 -- 99 % -- --  08/24/22 1312 -- -- Oral -- 18 100 % 5' 2.5" (1.588 m) 68.6 kg  08/24/22 1308 -- 98.3 F (36.8 C) -- (!) 107 -- -- -- --   Patient Vitals for the past 24 hrs:  BP Temp Temp src Pulse Resp SpO2 Height Weight  08/24/22 1319 128/72 -- -- 93 -- 99 % -- --  08/24/22 1312 -- -- Oral -- 18 100 % 5' 2.5" (1.588 m) 68.6 kg  08/24/22 1308 -- 98.3 F (36.8 C) -- (!) 107 -- -- -- --    Constitutional: Well-developed,  well-nourished female in mild acute distress.  Skin: No pallor. Pos diaphoresis.  Cardiovascular: Mild tachycardia. Nml Rhythm. No M/R/G Respiratory: normal rate. Increased effort. CTAB. No wheezing.  GI: Abd soft, + RUQ TTP, slight guarding. Pos BS x 4 MS: Extremities nontender, trace edema in fingers and ankles, normal ROM Neurologic: Alert and oriented x 4.  GU: Neg CVAT.  SPECULUM EXAM: NEFG, physiologic discharge, no blood noted, cervix clean  BIMANUAL: cervix ***; uterus normal size, no adnexal tenderness or masses.  No CMT.  LAB RESULTS Results for orders placed or performed during the hospital encounter of 08/24/22 (from the past 24 hour(s))  hCG, quantitative, pregnancy     Status: Abnormal   Collection Time: 08/24/22  1:02 PM  Result Value Ref Range   hCG, Beta Chain, Quant, S 88,478 (H) <5 mIU/mL  CBC     Status: None   Collection Time: 08/24/22  1:02 PM  Result Value Ref Range   WBC 6.1 4.0 - 10.5 K/uL   RBC 4.31 3.87 - 5.11 MIL/uL   Hemoglobin 12.1 12.0 - 15.0 g/dL   HCT 36.1 36.0 - 46.0 %   MCV 83.8 80.0 - 100.0 fL   MCH 28.1 26.0 - 34.0 pg   MCHC 33.5 30.0 - 36.0 g/dL   RDW 12.4 11.5 - 15.5 %   Platelets 303 150 - 400 K/uL   nRBC 0.0 0.0 - 0.2 %  HIV Antibody (routine testing w rflx)     Status: None   Collection Time: 08/24/22  1:02 PM  Result Value Ref Range   HIV Screen 4th Generation wRfx Non Reactive Non Reactive  Comprehensive metabolic panel     Status: Abnormal   Collection Time: 08/24/22  1:24 PM  Result Value Ref Range   Sodium 138 135 - 145 mmol/L   Potassium 3.5 3.5 - 5.1 mmol/L   Chloride 103 98 - 111 mmol/L   CO2 23 22 - 32 mmol/L   Glucose, Bld 96 70 - 99 mg/dL   BUN <5 (L) 6 - 20 mg/dL   Creatinine, Ser 0.72 0.44 - 1.00 mg/dL   Calcium 9.9 8.9 - 10.3 mg/dL   Total Protein 7.5 6.5 - 8.1 g/dL   Albumin 4.3 3.5 - 5.0 g/dL   AST 21 15 - 41 U/L   ALT 13 0 - 44 U/L   Alkaline Phosphatase 57 38 - 126 U/L   Total Bilirubin 0.7 0.3 - 1.2 mg/dL    GFR, Estimated >60 >60 mL/min   Anion gap 12 5 - 15  Amylase     Status: None  Collection Time: 08/24/22  1:24 PM  Result Value Ref Range   Amylase 89 28 - 100 U/L  Lipase, blood     Status: None   Collection Time: 08/24/22  1:24 PM  Result Value Ref Range   Lipase 39 11 - 51 U/L  Wet prep, genital     Status: Abnormal   Collection Time: 08/24/22  2:29 PM  Result Value Ref Range   Yeast Wet Prep HPF POC NONE SEEN NONE SEEN   Trich, Wet Prep NONE SEEN NONE SEEN   Clue Cells Wet Prep HPF POC PRESENT (A) NONE SEEN   WBC, Wet Prep HPF POC >=10 (A) <10   Sperm NONE SEEN     IMAGING US Abdomen Limited RUQ (LIVER/GB)  Result Date: 08/24/2022 CLINICAL DATA:  Abdominal pain for 1 day. EXAM: ULTRASOUND ABDOMEN LIMITED RIGHT UPPER QUADRANT COMPARISON:  None Available. FINDINGS: Gallbladder: No gallstones or wall thickening visualized. No sonographic Murphy sign noted by sonographer. Common bile duct: Diameter: 3 mm Liver: No focal lesion identified. Within normal limits in parenchymal echogenicity. Portal vein is patent on color Doppler imaging with normal direction of blood flow towards the liver. Other: None. IMPRESSION: Normal right upper quadrant ultrasound. Electronically Signed   By: Amie Portland M.D.   On: 08/24/2022 14:38   US OB Comp Less 14 Wks  Result Date: 08/24/2022 CLINICAL DATA:  Pelvic pain for 1 day. Pregnant patient. Patient is 11 weeks and 2 days pregnant based on the last menstrual period. EXAM: OBSTETRIC <14 WK ULTRASOUND TECHNIQUE: Transabdominal ultrasound was performed for evaluation of the gestation as well as the maternal uterus and adnexal regions. COMPARISON:  None Available. FINDINGS: Intrauterine gestational sac: Single Yolk sac:  Visualized. Embryo:  Visualized. Cardiac Activity: Visualized. Heart Rate: 159 bpm CRL:   10.4 mm   7 w 1 d                  [place] EDC: 04/11/2023 Subchorionic hemorrhage:  Small subchronic hemorrhage. Maternal uterus/adnexae: No uterine  masses. Normal ovaries and adnexa. IMPRESSION: 1. Single live intrauterine pregnancy with a measured gestational age of [redacted] weeks and 1 day. 2. Small subchronic hemorrhage.  No other pregnancy complication. Electronically Signed   By: Amie Portland M.D.   On: 08/24/2022 14:37    MAU COURSE Orders Placed This Encounter  Procedures  . Wet prep, genital  . US Abdomen Limited RUQ (LIVER/GB)  . US OB Comp Less 14 Wks  . hCG, quantitative, pregnancy  . CBC  . HIV Antibody (routine testing w rflx)  . Comprehensive metabolic panel  . Amylase  . Lipase, blood  . Diet NPO time specified  . EKG 12-Lead  . EKG 12-Lead  . Insert peripheral IV  . Discharge patient     MDM RUQ pain referring to right shoulder in early pregnancy with normal intrauterine pregnancy and hemodynamically stable.  Nml RQU [place], liver enzymes, CBC, Amylase and Lipase making Liver, Gall Bladder and Pancreatic cause of pain unlikely.   ASSESSMENT 1. Abdominal pain during pregnancy in first trimester   2. RUQ pain   3. Constipation during pregnancy in first trimester   4. SOB (shortness of breath)   5. Abdominal pain during intrauterine pregnancy   6. [redacted] weeks gestation of pregnancy     PLAN Discharge home in stable condition. *** precautions  Follow-up Information     [facility] [address] Follow up.   Why: Start prenatal care Contact information: [address] [place]  [id number] [phone]         [facility] Follow up.   Specialty: Obstetrics and Gynecology Why: As needed if symptoms worsen Contact information: [address] [id number] [place] [id number] [phone]               Allergies as of 08/24/2022       Reactions   Penicillins Rash        Medication List     STOP taking these medications    amphetamine-dextroamphetamine 10 MG tablet Commonly known as: Adderall       TAKE these medications    polyethylene glycol  powder 17 GM/SCOOP powder Commonly known as: GLYCOLAX/MIRALAX Take 17 g by mouth daily as needed for moderate constipation.         Katrinka BlazingSmith, [place], [place] 08/24/2022  5:08 PM  4

## 2022-08-24 NOTE — MAU Note (Signed)
Pt reports to mau with c/o SOB, upper right abd pain that radiates into her right shoulder.  States this all started this morning.  Also reports an ongoing headache for the past few weeks. Has not taken any pain meds.

## 2022-08-25 LAB — GC/CHLAMYDIA PROBE AMP (~~LOC~~) NOT AT ARMC
Chlamydia: NEGATIVE
Comment: NEGATIVE
Comment: NORMAL
Neisseria Gonorrhea: NEGATIVE

## 2022-09-12 LAB — HEPATITIS C ANTIBODY: HCV Ab: NEGATIVE

## 2022-09-12 LAB — OB RESULTS CONSOLE HEPATITIS B SURFACE ANTIGEN: Hepatitis B Surface Ag: NEGATIVE

## 2022-09-15 LAB — OB RESULTS CONSOLE RUBELLA ANTIBODY, IGM: Rubella: IMMUNE

## 2022-10-10 ENCOUNTER — Ambulatory Visit: Payer: 59 | Admitting: Family Medicine

## 2022-10-13 ENCOUNTER — Telehealth: Payer: 59 | Admitting: Family Medicine

## 2022-11-14 ENCOUNTER — Inpatient Hospital Stay (HOSPITAL_COMMUNITY)
Admission: AD | Admit: 2022-11-14 | Discharge: 2022-11-14 | Disposition: A | Discharge status: Home / Self Care | Payer: BC Managed Care – PPO | Attending: Obstetrics & Gynecology | Admitting: Obstetrics & Gynecology

## 2022-11-14 ENCOUNTER — Encounter (HOSPITAL_COMMUNITY): Payer: Self-pay | Admitting: *Deleted

## 2022-11-14 ENCOUNTER — Inpatient Hospital Stay (HOSPITAL_COMMUNITY): Payer: BC Managed Care – PPO

## 2022-11-14 DIAGNOSIS — O26892 Other specified pregnancy related conditions, second trimester: Secondary | ICD-10-CM | POA: Diagnosis not present

## 2022-11-14 DIAGNOSIS — O98512 Other viral diseases complicating pregnancy, second trimester: Secondary | ICD-10-CM | POA: Insufficient documentation

## 2022-11-14 DIAGNOSIS — R0789 Other chest pain: Secondary | ICD-10-CM | POA: Diagnosis present

## 2022-11-14 DIAGNOSIS — U071 COVID-19: Secondary | ICD-10-CM | POA: Diagnosis not present

## 2022-11-14 DIAGNOSIS — Z3A18 18 weeks gestation of pregnancy: Secondary | ICD-10-CM | POA: Insufficient documentation

## 2022-11-14 LAB — GROUP A STREP BY PCR: Group A Strep by PCR: NOT DETECTED

## 2022-11-14 LAB — RESP PANEL BY RT-PCR (FLU A&B, COVID) ARPGX2
Influenza A by PCR: NEGATIVE
Influenza B by PCR: NEGATIVE
SARS Coronavirus 2 by RT PCR: POSITIVE — AB

## 2022-11-14 MED ORDER — PAXLOVID (300/100) 20 X 150 MG & 10 X 100MG PO TBPK: 3.0000 | ORAL_TABLET | Freq: Two times a day (BID) | ORAL | 0 refills | Status: DC

## 2022-11-14 NOTE — MAU Note (Signed)
Misty Ayers is a 31 y.o. at [redacted]w[redacted]d here in MAU reporting: yesterday had sore throat and chest pain(starts at base of throat, down into chest).  Today she has a fever(101.4- highest) and feels like she has been hit by a truck. Body aches everywhere. Called her midwife and was instructed to come in .  Took Tylenol today, last dose was an hour ago.  Pain at back of neck shoulders up into head. Feels SOB, denies runny nose, no real cough. Onset of complaint: started yesterday Pain score: body-9,throat/chest 4/ neck 9 Vitals:   11/14/22 1717 11/14/22 1724  BP:  127/75  Pulse:  100  Resp:  15  Temp:  99.7 F (37.6 C)  SpO2: 100% 100%     FHT:158 Lab orders placed from triage:

## 2022-11-14 NOTE — MAU Provider Note (Signed)
History     CSN: [id number]  Arrival date and time: 11/14/22 1649   Event Date/Time   First Provider Initiated Contact with Patient 11/14/22 1830      Chief Complaint  Patient presents with   Generalized Body Aches   Fever   HPI Misty Ayers is a 31 y.o. [id number] at [redacted]w[redacted]d who receives care at [facility].  She presents today for Generalized Body Aches and Fever  She states she was running a fever and was told by her midwife, Marchelle Folks, to come in.  She reports her temperature was 99.7, 100.7, 101.  She states she took tylenol about 45 minutes prior to arrival. She reports she had a sore throat yesterday, but none today.  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  She has not had a influenza vaccine and has had Covid vaccine x 2, plus booster.   OB History     Gravida  3   Para  1   Term  0   Preterm  1   AB  1   Living  1      SAB  1   IAB  0   Ectopic  0   Multiple  0   Live Births  1           Past Medical History:  Diagnosis Date   ADHD    Anemia    Anxiety    Depression     Past Surgical History:  Procedure Laterality Date   LEEP      Family History  Problem Relation Age of Onset   Kidney disease Mother    Stroke Mother    Hyperlipidemia Mother    Hypertension Mother    Mental illness Mother    Hypertension Father    Stroke Brother    Heart attack Maternal Grandfather     Social History   Tobacco Use   Smoking status: Never   Smokeless tobacco: Never  Vaping Use   Vaping Use: Never used  Substance Use Topics   Alcohol use: Not Currently    Comment: 3-5   Drug use: No    Allergies:  Allergies  Allergen Reactions   Penicillins Rash    Medications Prior to Admission  Medication Sig Dispense Refill Last Dose   acetaminophen (TYLENOL) 500 MG tablet Take 1,000 mg by mouth every 6 (six) hours as needed.   11/14/2022 at  1620   polyethylene glycol powder (GLYCOLAX/MIRALAX) 17 GM/SCOOP powder Take 17 g by mouth daily as needed for moderate constipation. 500 g 2     Review of Systems  Constitutional:  Positive for chills and fever.  HENT:  Positive for sore throat (No soreness today.). Negative for congestion.   Respiratory:  Positive for chest tightness (Heaviness) and shortness of breath (Reports normal and elaborates that it is not worsened than prior).   Gastrointestinal:  Positive for constipation, nausea and vomiting. Negative for abdominal pain and diarrhea. Blood in stool: Usual pregnancy. Genitourinary:  Negative for difficulty urinating, dysuria, vaginal bleeding and vaginal discharge.  Musculoskeletal:  Positive for back pain, myalgias, neck pain and neck stiffness.  Neurological:  Positive for dizziness, light-headedness and headaches (Back and front of head; tightness and throbbing).   Physical Exam   Blood pressure 127/75, pulse 100, temperature 99.7 F (37.6 C), temperature source Oral, resp. rate  15, height 5\' 3"  (1.6 m), weight 64.4 kg, last menstrual period 06/06/2022, SpO2 100 %, currently breastfeeding.  Physical Exam Vitals reviewed.  Constitutional:      Appearance: Normal appearance.  HENT:     Head: Normocephalic and atraumatic.  Eyes:     Conjunctiva/sclera: Conjunctivae normal.  Cardiovascular:     Rate and Rhythm: Regular rhythm. Tachycardia present.     Heart sounds: Normal heart sounds.  Pulmonary:     Effort: Pulmonary effort is normal. No respiratory distress.     Breath sounds: Normal breath sounds.  Abdominal:     General: Bowel sounds are normal.     Palpations: Abdomen is soft.     Tenderness: There is no abdominal tenderness.  Musculoskeletal:        General: Normal range of motion.     Cervical back: Normal range of motion.  Skin:    General: Skin is warm and dry.  Neurological:     Mental Status: She is alert and oriented to person, place, and time.   Psychiatric:        Mood and Affect: Mood normal.        Behavior: Behavior normal.     MAU Course  Procedures Results for orders placed or performed during the hospital encounter of 11/14/22 (from the past 24 hour(s))  Group A Strep by PCR     Status: None   Collection Time: 11/14/22  6:15 PM   Specimen: Throat; Sterile Swab  Result Value Ref Range   Group A Strep by PCR NOT DETECTED NOT DETECTED  Resp Panel by RT-PCR (Flu A&B, Covid) Throat     Status: Abnormal   Collection Time: 11/14/22  6:15 PM   Specimen: Throat; Nasal Swab  Result Value Ref Range   SARS Coronavirus 2 by RT PCR POSITIVE (A) NEGATIVE   Influenza A by PCR NEGATIVE NEGATIVE   Influenza B by PCR NEGATIVE NEGATIVE   DG CHEST PORT 1 VIEW  Result Date: 11/14/2022 CLINICAL DATA:  Chest pressure. Flu like symptoms. EXAM: PORTABLE CHEST 1 VIEW COMPARISON:  01/13/2018 FINDINGS: The cardiomediastinal contours are normal. Minimal ill-defined opacity at the lung bases. Pulmonary vasculature is normal. No pleural effusion or pneumothorax. No acute osseous abnormalities are seen. IMPRESSION: Minimal ill-defined opacity at the lung bases, may be atelectasis or atypical pneumonia in the appropriate clinical setting. Electronically Signed   By: 03/13/2018 M.D.   On: 11/14/2022 19:09    MDM Labs: Covid Panel, Strep A Chest X Ray Prescription Assessment and Plan  31 year old, 37  SIUP at 18.6 weeks Flu Like Symptoms Chest Heaviness  -Reviewed POC with patient. -Exam performed and findings discussed.  -Informed that provider concern is for chest pressure/heaviness. -Labs ordered and collected. -Will order CXR. -Monitor and await results.   [id number] 11/14/2022, 6:30 PM   Reassessment (7:37 PM)  -Informed of results as above. -Addressed patient questions regarding tylenol safety and paxlovid usage. -Patient initially declines, but then requests paxlovid.  -Rx sent to pharmacy on file.  -Discussed  quarantine and return to work. -Patient without further questions. -Information provided in AVS.  -Patient encouraged to follow CDC recommended guidelines. -Encouraged to call primary office or return to MAU if symptoms worsen or with the onset of new symptoms. -Discharged to home in stable condition.   14/07/2022 MSN, CNM Advanced Practice Provider, [facility]

## 2022-11-14 NOTE — Discharge Instructions (Signed)

## 2022-12-08 NOTE — L&D Delivery Note (Signed)
   Delivery Note:   [id number] at [redacted]w[redacted]d  Admitting diagnosis: Normal labor [O80, Z37.9] Risks: IUGR at 32 weeks, resolved at 35 weeks Onset of labor: 03/20/2023 at 1230 IOL/Augmentation: AROM ROM: 03/20/2023 at 1432, clear fluid  Complete dilation at 03/20/2023  1909 Onset of pushing at 1910  Analgesia/Anesthesia intrapartum:None  Pushing in hands and knees on the toilet and then on the bed with CNM and L&D staff support. Husband, Misty Ayers, present for birth and supportive.  Delivery of a Live born female  Birth Weight:  pending APGAR: 9, 9  Newborn Delivery   Birth date/time: 03/20/2023 19:13:00 Delivery type: Vaginal, Spontaneous     in cephalic presentation, position OA to ROA.  APGAR:1 min-9 , 5 min-9   Nuchal Cord: Yes  x 1 Cord double clamped after cessation of pulsation, cut by Misty Ayers.  Collection of cord blood for typing completed. Arterial cord blood sample-No    Placenta delivered-Spontaneous  with 3 vessels . Uterotonics: None Placenta to L&D Uterine tone firm  Bleeding scant  1st degree  laceration identified. Hemostatic, declined repair Episiotomy:None  Local analgesia: N/A  Repair: None Est. Blood Loss (mL):50.00   Complications: None  Mom to postpartum. [name] to Couplet care / Skin to Skin.  Delivery Report:  Review the Delivery Report for details.    June Leap, CNM, MSN 03/20/2023, 7:32 PM

## 2023-01-21 LAB — OB RESULTS CONSOLE RPR: RPR: NONREACTIVE

## 2023-01-30 IMAGING — US US MFM FETAL BPP W/O NON-STRESS
1 series · 15 of 24 positions shown · non-contrast
Comparison: none

[Series 1: us mfm fetal bpp w/o non-stress · 24 acquisitions, 15 frames shown]
[im 1/24]
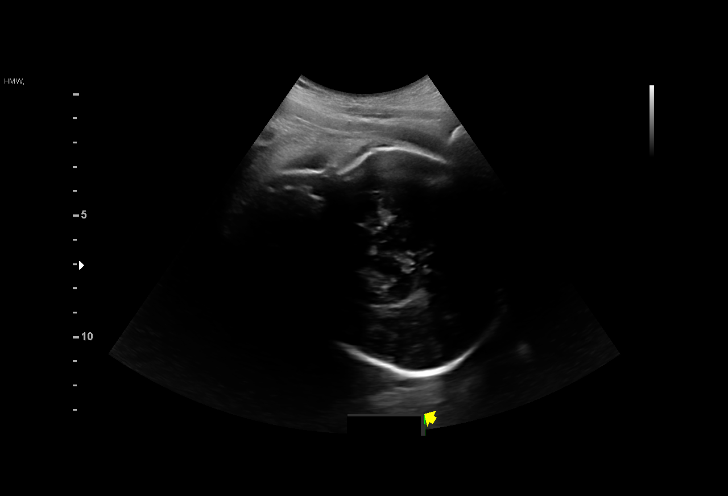
[im 3/24]
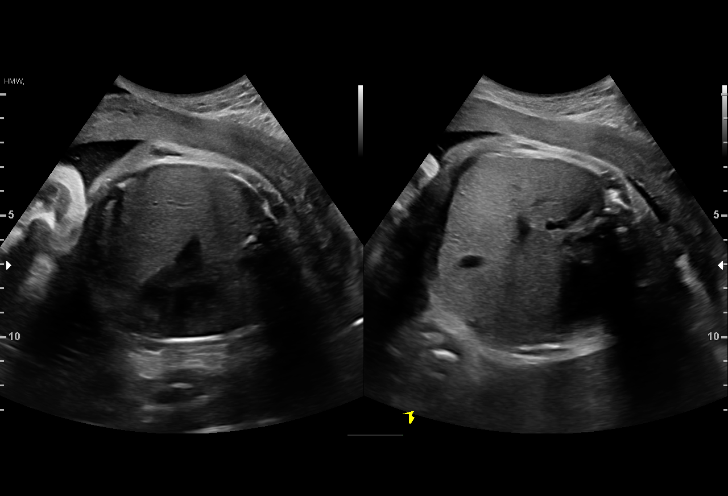
[im 5/24]
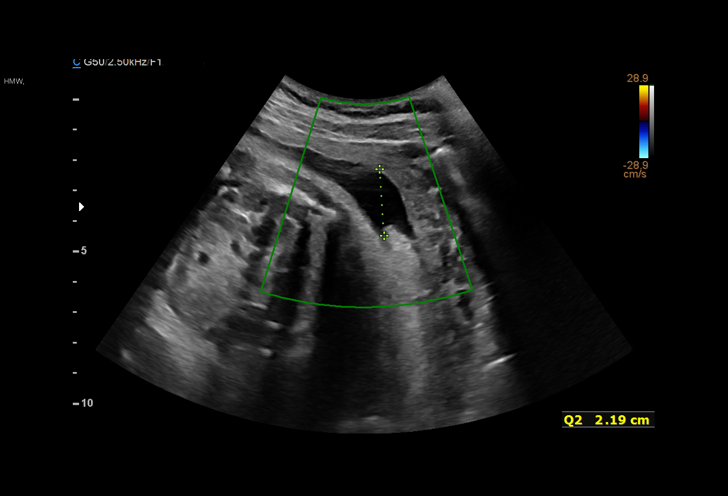
[im 6/24]
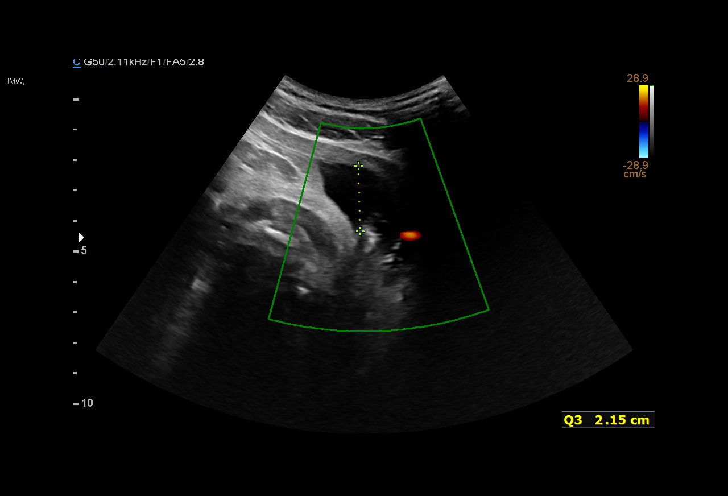
[im 8/24]
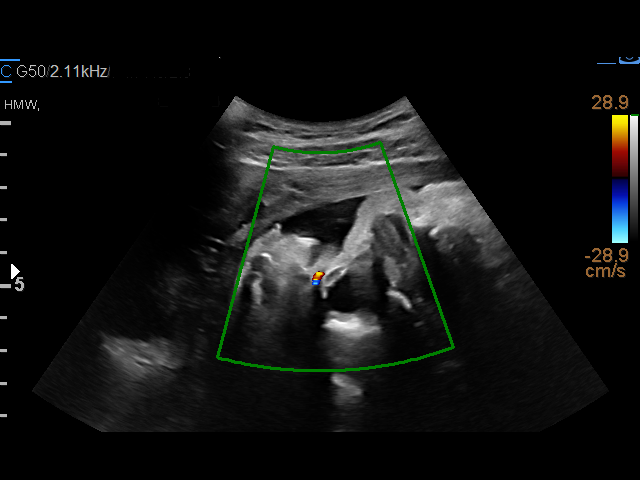
[im 9/24]
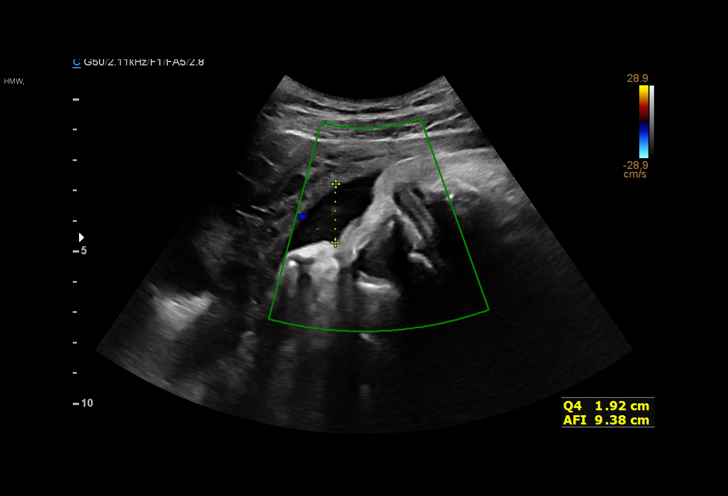
[im 11/24]
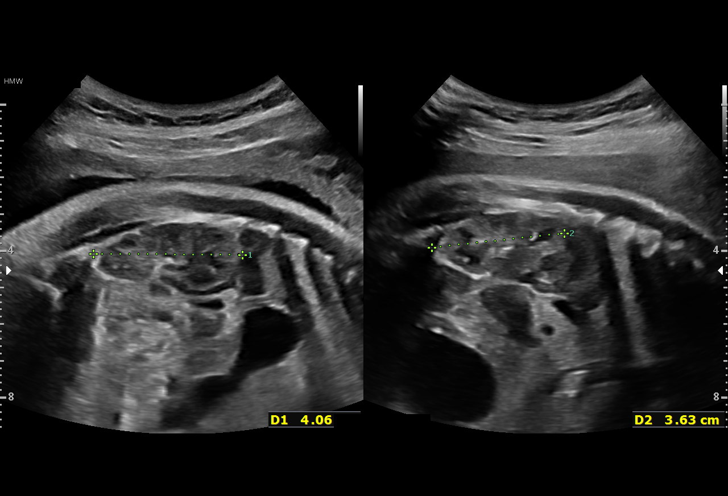
[im 13/24]
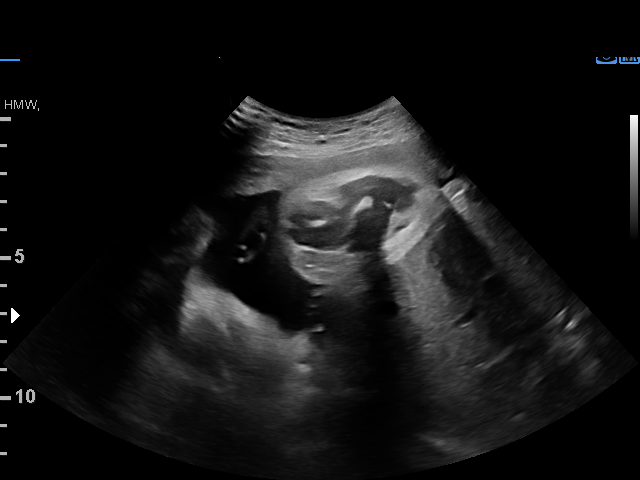
[im 14/24]
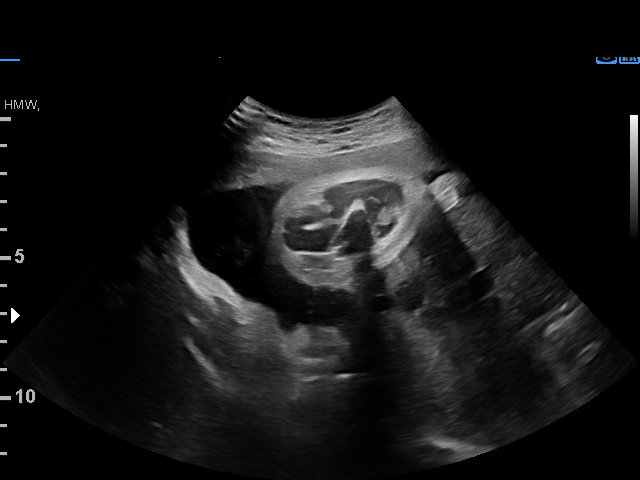
[im 16/24]
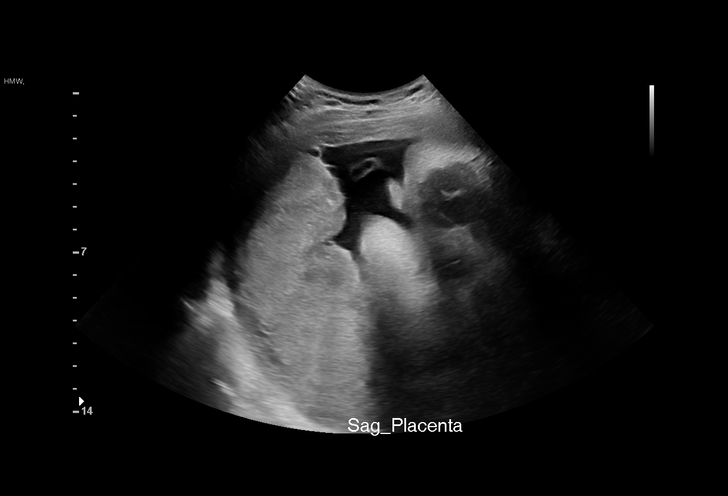
[im 17/24]
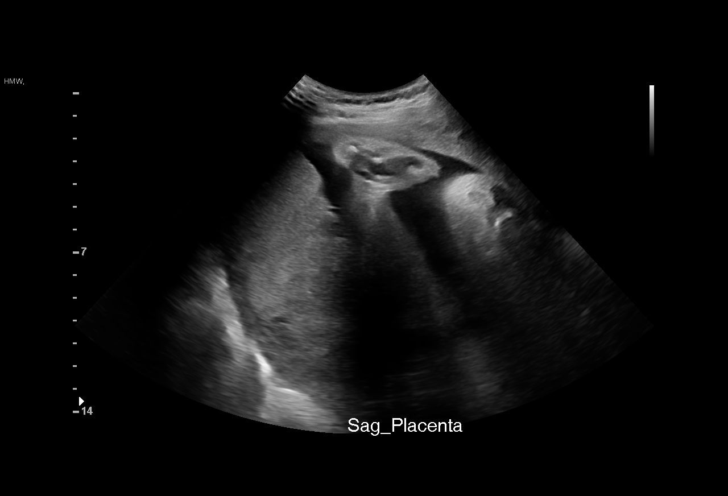
[im 19/24]
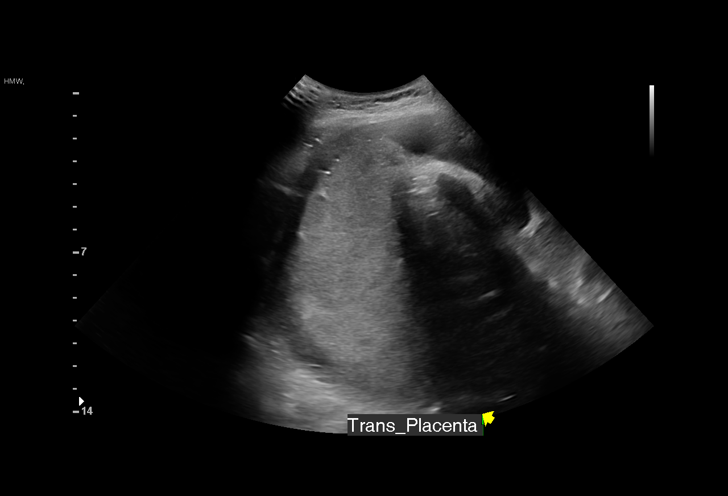
[im 21/24]
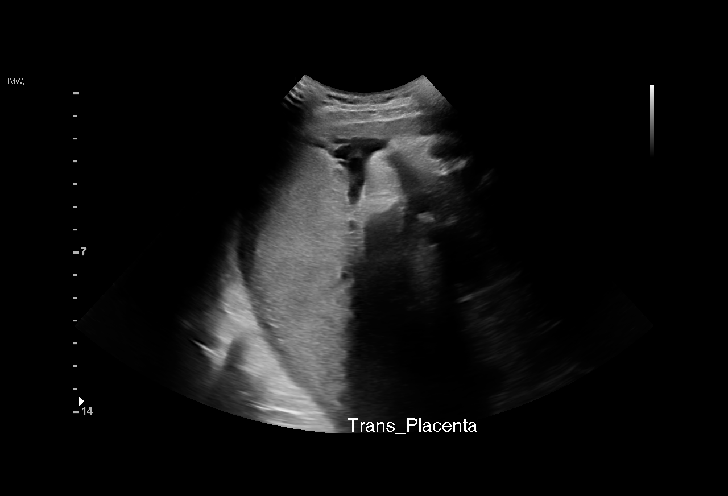
[im 22/24]
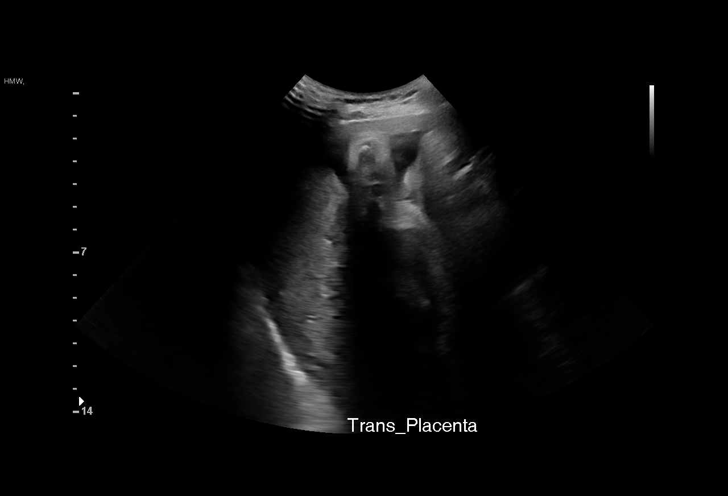
[im 24/24]
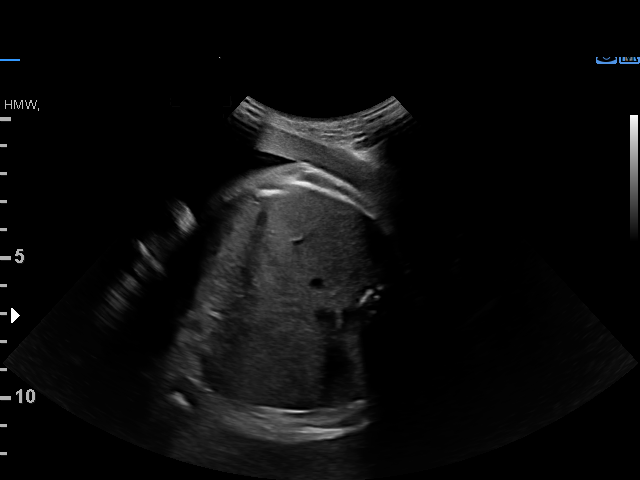

[15 of 24 positions shown; findings below may reference images not displayed]

Indications

 Variable fetal heart rate decelerations,
 antepartum
 36 weeks gestation of pregnancy
 Non-reactive NST
 Preterm labor
Fetal Evaluation

 Num Of Fetuses:         1
 Fetal Heart Rate(bpm):  150
 Cardiac Activity:       Observed
 Presentation:           Cephalic
 Placenta:               Posterior

 Amniotic Fluid
 AFI FV:      Within normal limits

 AFI Sum(cm)     %Tile       Largest Pocket(cm)
 9.4             19

 RUQ(cm)       RLQ(cm)       LUQ(cm)        LLQ(cm)

Biophysical Evaluation

 Amniotic F.V:   Pocket => 2 cm             F. Tone:        Observed
 F. Movement:    Observed                   Score:          [DATE]
 F. Breathing:   Not Observed
OB History

 Gravidity:    2         Term:   0        Prem:   0        SAB:   1
 TOP:          0       Ectopic:  0        Living: 0
Gestational Age

 LMP:           36w 5d        Date:  06/29/20                 EDD:   04/05/21
 Best:          36w 5d     Det. By:  LMP  (06/29/20)          EDD:   04/05/21
Anatomy

 Thoracic:              Appears normal         Bladder:                Appears normal
 Stomach:               Appears normal, left
                        sided
Comments

 This patient presented to the [facility] in early labor.
 A biophysical profile performed today was [DATE].  She
 received a -2 for fetal breathing movements that did not meet
 criteria.
 Low normal amniotic fluid with a total AFI of 9.4 cm was
 noted.

## 2023-02-27 ENCOUNTER — Encounter (HOSPITAL_COMMUNITY): Payer: Self-pay | Admitting: Obstetrics

## 2023-02-27 ENCOUNTER — Encounter: Payer: Self-pay | Admitting: Obstetrics and Gynecology

## 2023-02-27 ENCOUNTER — Inpatient Hospital Stay (HOSPITAL_COMMUNITY)
Admission: AD | Admit: 2023-02-27 | Discharge: 2023-02-27 | Disposition: A | Discharge status: Home / Self Care | Payer: BC Managed Care – PPO | Attending: Obstetrics | Admitting: Obstetrics

## 2023-02-27 DIAGNOSIS — O479 False labor, unspecified: Secondary | ICD-10-CM | POA: Diagnosis not present

## 2023-02-27 DIAGNOSIS — Z3A34 34 weeks gestation of pregnancy: Secondary | ICD-10-CM | POA: Diagnosis not present

## 2023-02-27 DIAGNOSIS — O23593 Infection of other part of genital tract in pregnancy, third trimester: Secondary | ICD-10-CM | POA: Diagnosis present

## 2023-02-27 DIAGNOSIS — O365930 Maternal care for other known or suspected poor fetal growth, third trimester, not applicable or unspecified: Secondary | ICD-10-CM | POA: Diagnosis not present

## 2023-02-27 NOTE — MAU Provider Note (Signed)
History     CSN: [id number]  Arrival date and time: 02/27/23 1552   Event Date/Time   First Provider Initiated Contact with Patient 02/27/23 1636      Chief Complaint  Patient presents with   Contractions   HPI Misty Ayers is 32 y.o. [id number] at [redacted]w[redacted]d who presents to MAU for contractions. She reports she has had intermittent contractions since 2/27 but contractions worsened last night. She reports she has not been timing them but they have gotten more painful. She also notes some vaginal spotting. She was seen in the office today and tested positive for both yeast and BV and given prescriptions for Diflucan and Flagyl. Her cervix was 1-2cm and was sent here for further evaluation. She denies leaking fluid or heavy vaginal bleeding. She endorses active fetal movement.  Pregnancy course: complicated by IUGR and marginal cord insertion. She receives [facility], next appointment is on Monday 3/25.  OB History     Gravida  3   Para  1   Term  0   Preterm  1   AB  1   Living  1      SAB  1   IAB  0   Ectopic  0   Multiple  0   Live Births  1           Past Medical History:  Diagnosis Date   ADHD    Anemia    Anxiety    Depression     Past Surgical History:  Procedure Laterality Date   LEEP      Family History  Problem Relation Age of Onset   Kidney disease Mother    Stroke Mother    Hyperlipidemia Mother    Hypertension Mother    Mental illness Mother    Hypertension Father    Stroke Brother    Heart attack Maternal Grandfather     Social History   Tobacco Use   Smoking status: Never   Smokeless tobacco: Never  Vaping Use   Vaping Use: Never used  Substance Use Topics   Alcohol use: Not Currently    Comment: 3-5   Drug use: No    Allergies:  Allergies  Allergen Reactions   Penicillins Rash    No medications prior to admission.   Review of Systems  Gastrointestinal:  Positive for abdominal pain  (contractions).  Genitourinary:  Positive for vaginal bleeding (spotting).  All other systems reviewed and are negative.  Physical Exam  Patient Vitals for the past 24 hrs:  BP Temp Temp src Pulse Resp SpO2 Height Weight  02/27/23 1828 -- -- -- -- 18 -- -- --  02/27/23 1605 127/77 97.7 F (36.5 C) Oral 79 16 100 % 5' 2.5" (1.588 m) 67.5 kg   Physical Exam Vitals and nursing note reviewed. Exam conducted with a chaperone present.  Constitutional:      General: She is not in acute distress. Eyes:     Extraocular Movements: Extraocular movements intact.     Pupils: Pupils are equal, round, and reactive to light.  Cardiovascular:     Rate and Rhythm: Normal rate.  Pulmonary:     Effort: Pulmonary effort is normal. No respiratory distress.  Abdominal:     Palpations: Abdomen is soft.     Tenderness: There is no abdominal tenderness.     Comments: Gravid   Musculoskeletal:        General: Normal range of motion.  Skin:  General: Skin is warm and dry.  Neurological:     General: No focal deficit present.     Mental Status: She is alert and oriented to person, place, and time.  Psychiatric:        Mood and Affect: Mood normal.        Behavior: Behavior normal.   Dilation: 1 Cervical Position: Posterior Exam by:: D. Dalayla Aldredge,CNM  NST FHR: 140 bpm, moderate variability, +15x15 accels, +1 early decel (with 3 minute ctx) Toco: irregular  MAU Course  Procedures  MDM PO hydration NST  Irregular contractions and ui. Cervix unchanged from previous exam. PO hydration. I offered a recheck after 1-2 hours and patient declines as contractions have improved after hydration. NST reactive and reassuring. 1 single early decel. Patient watched x45 mins without further decelerations. Patient has f/u appointment on Monday.   Assessment and Plan   1. [redacted] weeks gestation of pregnancy   2. Uterine contractions    - Discharge home in stable condition - Pick up RX's as prescribed -  Return precautions given. Return to MAU as needed - Keep OB appointment as scheduled on Monday 3/25  Renee Harder, [place] 02/27/2023, 7:39 PM

## 2023-02-27 NOTE — MAU Note (Signed)
...  Misty Ayers is a 32 y.o. at [redacted]w[redacted]d here in MAU reporting: Sent over from the office for evaluation of preterm labor. She reports she has been contracting since 2/27 but reports yesterday her CTX became more intense and more regular. She reports they ran her urine yesterday as well as collected vaginal swabs. She reports her urine was positive for Leukocytes and her swabs tested positive for BV and yeast. She reports she has been experiencing light pink vaginal spotting.   Patient has Diflucan and Fluconazole sent to her pharmacy and patient's MD is awaiting urine culture results to determine if she needs an abx.  Closed in office yesterday. Today 1-2 cm. Hx preterm delivery at [redacted]w[redacted]d.  Pain score: 6/10 lower abdomen Vitals:   02/27/23 1605  BP: 127/77  Pulse: 79  Resp: 16  Temp: 97.7 F (36.5 C)  SpO2: 100%     FHT: 145 initial external Lab orders placed from triage:  none - patient requested not to collect UA since collected yesterday in office

## 2023-03-04 LAB — OB RESULTS CONSOLE GBS: GBS: NEGATIVE

## 2023-03-20 ENCOUNTER — Inpatient Hospital Stay (HOSPITAL_COMMUNITY)
Admission: AD | Admit: 2023-03-20 | Discharge: 2023-03-21 | DRG: 807 | Disposition: A | Discharge status: Home / Self Care | Payer: BC Managed Care – PPO | Attending: Obstetrics & Gynecology | Admitting: Obstetrics & Gynecology

## 2023-03-20 ENCOUNTER — Encounter (HOSPITAL_COMMUNITY): Payer: Self-pay | Admitting: Obstetrics & Gynecology

## 2023-03-20 ENCOUNTER — Other Ambulatory Visit: Payer: Self-pay

## 2023-03-20 DIAGNOSIS — O365930 Maternal care for other known or suspected poor fetal growth, third trimester, not applicable or unspecified: Secondary | ICD-10-CM | POA: Diagnosis present

## 2023-03-20 DIAGNOSIS — Z3A37 37 weeks gestation of pregnancy: Secondary | ICD-10-CM | POA: Diagnosis not present

## 2023-03-20 DIAGNOSIS — O26893 Other specified pregnancy related conditions, third trimester: Secondary | ICD-10-CM | POA: Diagnosis present

## 2023-03-20 DIAGNOSIS — Z8659 Personal history of other mental and behavioral disorders: Secondary | ICD-10-CM

## 2023-03-20 LAB — CBC
HCT: 34.2 % — ABNORMAL LOW (ref 36.0–46.0)
Hemoglobin: 11.3 g/dL — ABNORMAL LOW (ref 12.0–15.0)
MCH: 27 pg (ref 26.0–34.0)
MCHC: 33 g/dL (ref 30.0–36.0)
MCV: 81.6 fL (ref 80.0–100.0)
Platelets: 217 10*3/uL (ref 150–400)
RBC: 4.19 MIL/uL (ref 3.87–5.11)
RDW: 13.2 % (ref 11.5–15.5)
WBC: 7 10*3/uL (ref 4.0–10.5)
nRBC: 0 % (ref 0.0–0.2)

## 2023-03-20 LAB — TYPE AND SCREEN
ABO/RH(D): O POS
Antibody Screen: NEGATIVE

## 2023-03-20 MED ORDER — ONDANSETRON HCL 4 MG PO TABS: 4.0000 mg | ORAL_TABLET | ORAL | Status: DC | PRN

## 2023-03-20 MED ORDER — MISOPROSTOL 25 MCG QUARTER TABLET
25.0000 ug | ORAL_TABLET | Freq: Once | ORAL | Status: AC
  Administered 2023-03-20: 25 ug via BUCCAL
  Filled 2023-03-20: qty 1

## 2023-03-20 MED ORDER — SIMETHICONE 80 MG PO CHEW: 80.0000 mg | CHEWABLE_TABLET | ORAL | Status: DC | PRN

## 2023-03-20 MED ORDER — SENNOSIDES-DOCUSATE SODIUM 8.6-50 MG PO TABS
2.0000 | ORAL_TABLET | Freq: Every day | ORAL | Status: DC
  Administered 2023-03-21: 2 via ORAL
  Filled 2023-03-20: qty 2

## 2023-03-20 MED ORDER — ACETAMINOPHEN 325 MG PO TABS
650.0000 mg | ORAL_TABLET | ORAL | Status: DC | PRN
  Administered 2023-03-20 – 2023-03-21 (×2): 650 mg via ORAL
  Filled 2023-03-20 (×2): qty 2

## 2023-03-20 MED ORDER — BENZOCAINE-MENTHOL 20-0.5 % EX AERO
1.0000 | INHALATION_SPRAY | CUTANEOUS | Status: DC | PRN
  Administered 2023-03-20: 1 via TOPICAL

## 2023-03-20 MED ORDER — OXYTOCIN 10 UNIT/ML IJ SOLN: 10.0000 [IU] | Freq: Once | INTRAMUSCULAR | Status: DC

## 2023-03-20 MED ORDER — LIDOCAINE HCL (PF) 1 % IJ SOLN: 30.0000 mL | INTRAMUSCULAR | Status: DC | PRN

## 2023-03-20 MED ORDER — FENTANYL CITRATE (PF) 100 MCG/2ML IJ SOLN: 50.0000 ug | INTRAMUSCULAR | Status: DC | PRN

## 2023-03-20 MED ORDER — IBUPROFEN 600 MG PO TABS
600.0000 mg | ORAL_TABLET | Freq: Four times a day (QID) | ORAL | Status: DC
  Administered 2023-03-20 – 2023-03-21 (×4): 600 mg via ORAL
  Filled 2023-03-20 (×4): qty 1

## 2023-03-20 MED ORDER — ACETAMINOPHEN 500 MG PO TABS: 1000.0000 mg | ORAL_TABLET | Freq: Four times a day (QID) | ORAL | Status: DC | PRN

## 2023-03-20 MED ORDER — COCONUT OIL OIL: 1.0000 | TOPICAL_OIL | Status: DC | PRN

## 2023-03-20 MED ORDER — SOD CITRATE-CITRIC ACID 500-334 MG/5ML PO SOLN: 30.0000 mL | ORAL | Status: DC | PRN

## 2023-03-20 MED ORDER — ONDANSETRON HCL 4 MG/2ML IJ SOLN: 4.0000 mg | Freq: Four times a day (QID) | INTRAMUSCULAR | Status: DC | PRN

## 2023-03-20 MED ORDER — ONDANSETRON HCL 4 MG/2ML IJ SOLN: 4.0000 mg | INTRAMUSCULAR | Status: DC | PRN

## 2023-03-20 MED ORDER — WITCH HAZEL-GLYCERIN EX PADS: 1.0000 | MEDICATED_PAD | CUTANEOUS | Status: DC | PRN

## 2023-03-20 MED ORDER — TETANUS-DIPHTH-ACELL PERTUSSIS 5-2.5-18.5 LF-MCG/0.5 IM SUSY: 0.5000 mL | PREFILLED_SYRINGE | Freq: Once | INTRAMUSCULAR | Status: DC

## 2023-03-20 MED ORDER — ZOLPIDEM TARTRATE 5 MG PO TABS: 5.0000 mg | ORAL_TABLET | Freq: Every evening | ORAL | Status: DC | PRN

## 2023-03-20 MED ORDER — PRENATAL MULTIVITAMIN CH
1.0000 | ORAL_TABLET | Freq: Every day | ORAL | Status: DC
  Administered 2023-03-21: 1 via ORAL
  Filled 2023-03-20: qty 1

## 2023-03-20 MED ORDER — DIPHENHYDRAMINE HCL 25 MG PO CAPS: 25.0000 mg | ORAL_CAPSULE | Freq: Four times a day (QID) | ORAL | Status: DC | PRN

## 2023-03-20 MED ORDER — DIBUCAINE (PERIANAL) 1 % EX OINT: 1.0000 | TOPICAL_OINTMENT | CUTANEOUS | Status: DC | PRN

## 2023-03-20 NOTE — H&P (Signed)
OB ADMISSION/ HISTORY & PHYSICAL:  Admission Date: 03/20/2023  1:39 PM  Admit Diagnosis: Normal labor [O80, Z37.9]    Misty Ayers is a 32 y.o. female [phone] at [redacted]w[redacted]d presenting for labor. Patient seen this morning for a labor check in the office. Reported contractions at that time every 10 minutes. VE 3/80. Reports that contractions became more frequent after leaving the office and are now 3-5 minutes apart. VE in MAU 5/100/-2. Denies leaking of fluid or vaginal bleeding. Husband, Cletis Athens, present and supportive. Eagerly anticipating baby boy "[name]".   Prenatal History: [id number]   EDC: 04/10/2023 Prenatal care at [facility] since 10 weeks  Primary: A. Yetta Barre, CNM  Prenatal course complicated by: History of anxiety, currently stable without meds History of LEEP, CLs this pregnancy stable History of PTD at [redacted]w[redacted]d Threatened preterm labor, no meds, declined BMZ IUGR diagnosed at 32 weeks with AC 4% and overall growth 14%, improved to overall 24% and AC of 16% at 35 weeks  Prenatal Labs: ABO, Rh:   O POS Antibody: PENDING (04/12 1409) Rubella:   Immune RPR:   Non-reactive HBsAg:   Negative HIV: Non Reactive (09/17 1302)  GBS:   Negative 1 hr Glucola : 107 Genetic Screening: Low risk XY Panorama Ultrasound: normal XY anatomy with absent nasal bone, anterior placenta, last growth 24% overall at 35 weeks    Maternal Diabetes: No Genetic Screening: Normal Maternal Ultrasounds/Referrals: Normal Fetal Ultrasounds or other Referrals:  None Maternal Substance Abuse:  No Significant Maternal Medications:  None Significant Maternal Lab Results:  Group B Strep negative Other Comments:  None  Medical / Surgical History : Past medical history:  Past Medical History:  Diagnosis Date   ADHD    Anemia    Anxiety    Depression     Past surgical history:  Past Surgical History:  Procedure Laterality Date   LEEP      Family History:  Family History  Problem Relation Age  of Onset   Kidney disease Mother    Stroke Mother    Hyperlipidemia Mother    Hypertension Mother    Mental illness Mother    Hypertension Father    Stroke Brother    Heart attack Maternal Grandfather     Social History:  reports that she has never smoked. She has never used smokeless tobacco. She reports that she does not currently use alcohol. She reports that she does not use drugs.  Allergies: Penicillins   Current Medications at time of admission:  Medications Prior to Admission  Medication Sig Dispense Refill Last Dose   acetaminophen (TYLENOL) 500 MG tablet Take 1,000 mg by mouth every 6 (six) hours as needed.      fluconazole (DIFLUCAN) 150 MG tablet Take 150 mg by mouth once.      polyethylene glycol powder (GLYCOLAX/MIRALAX) 17 GM/SCOOP powder Take 17 g by mouth daily as needed for moderate constipation. 500 g 2    Prenatal Vit-Fe Fumarate-FA (PRENATAL MULTIVITAMIN) TABS tablet Take 1 tablet by mouth daily at 12 noon.       Review of Systems: Review of Systems  All other systems reviewed and are negative.  Physical Exam: Vital signs and nursing notes reviewed.  Patient Vitals for the past 24 hrs:  BP Temp Temp src Pulse Resp SpO2  03/20/23 1423 127/89 98.4 F (36.9 C) Oral (!) 101 16 100 %  03/20/23 1357 130/76 98.5 F (36.9 C) Oral 90 14 --    General: AAO x  3, NAD Heart: RRR Lungs:CTAB Abdomen: Gravid, NT Extremities: no edema SVE: Dilation: 5 Effacement (%): 100 Station: -1 Presentation: Vertex Exam by:: AYetta Barre CNM   FHR: 140BPM, moderate variability, + accels, no decels TOCO: Contractions every 1.5-4 minutes  Labs:   Recent Labs    03/20/23 1415  WBC 7.0  HGB 11.3*  HCT 34.2*  PLT 217   Assessment/Plan: 32 y.o. [id number] at [redacted]w[redacted]d, active labor History of anxiety, no current meds IUGR diagnosed at 32 weeks with AC 4% and overall growth 14%, improved to overall 24% and AC of 16% at 35 weeks  Fetal wellbeing - FHT category 1 EFW SGA  5-6lbs  Labor: Active labor with cervical change from the office this AM, plan expectant management  GBS negative Rubella immune Rh positive  Pain control: desires hydrotherapy and waterbirth, class complete Analgesia/anesthesia PRN  Anticipated MOD: NSVB  Plans to breastfeed, plans inpatient circumcision. POC discussed with patient and support team, all questions answered.  Dr. Juliene Pina notified of admission/plan of care.  June Leap CNM, MSN 03/20/2023, 2:58 PM

## 2023-03-20 NOTE — Progress Notes (Signed)
2050 baby has latched well x 2 for 10 minutes each time

## 2023-03-20 NOTE — Progress Notes (Addendum)
S: In and out of the tub, sitting on the toilet, reports difficulty coping. Husband, Cletis Athens, present and supportive. RN staff and CNM giving continuous labor support.   O: Vitals:   03/20/23 1357 03/20/23 1423  BP: 130/76 127/89  Pulse: 90 (!) 101  Resp: 14 16  Temp: 98.5 F (36.9 C) 98.4 F (36.9 C)  TempSrc: Oral Oral  SpO2:  100%   FHT:  FHR: 140 bpm, variability: moderate,  accelerations:  Present,  decelerations:  Absent UC:   irregular, every 5-7 minutes SVE:   Dilation: 8 Effacement (%): 100 Station: -1 Exam by:: A. Yetta Barre, CNM  A / P: Protracted active phase  Fetal Wellbeing:  Category I GBS: Negative Pain Control:  Water tub Anticipated MOD:  NSVD  Contractions have significantly spaced out in the last hour. Patient requesting rest. Placed in side lying position with peanut ball. Will give of buccal Cytotec for augmentation if no active labor in 2 hours.    June Leap, CNM, MSN 03/20/2023, 6:10 PM

## 2023-03-21 LAB — CBC
HCT: 31.5 % — ABNORMAL LOW (ref 36.0–46.0)
Hemoglobin: 10.4 g/dL — ABNORMAL LOW (ref 12.0–15.0)
MCH: 27 pg (ref 26.0–34.0)
MCHC: 33 g/dL (ref 30.0–36.0)
MCV: 81.8 fL (ref 80.0–100.0)
Platelets: 225 10*3/uL (ref 150–400)
RBC: 3.85 MIL/uL — ABNORMAL LOW (ref 3.87–5.11)
RDW: 13.2 % (ref 11.5–15.5)
WBC: 12.8 10*3/uL — ABNORMAL HIGH (ref 4.0–10.5)
nRBC: 0 % (ref 0.0–0.2)

## 2023-03-21 LAB — RPR: RPR Ser Ql: NONREACTIVE

## 2023-03-21 NOTE — Progress Notes (Signed)
Dorisann Frames, CNM in room during blood pressure check of 121/87. Dorisann Frames instructed to call MD if blood pressure 140/90 or greater. Earl Gala, Linda Hedges Advance

## 2023-03-21 NOTE — Progress Notes (Signed)
   PPD #1 S/P NSVD  Live born female  Birth Weight: 5 lb 10 oz (2550 g) APGAR: 9, 9  Newborn Delivery   Birth date/time: 03/20/2023 19:13:00 Delivery type: Vaginal, Spontaneous     Baby name: Misty Ayers  Delivering provider: Dorisann Frames K   Lacerations: 1st degree   Circumcision: Yes, planning inpatient  Feeding: breast  Pain control at delivery: None   S:  Reports feeling well. Ecstatic with birth experience, feel it was redemptive.              Tolerating PO/No nausea or vomiting             Bleeding is light             Pain controlled with acetaminophen and ibuprofen (OTC)             Up ad lib/ambulatory/voiding without difficulties   O:  A & O x 3, in no apparent distress  Vitals:   03/20/23 2107 03/20/23 2210 03/21/23 0115 03/21/23 0419  BP: 123/79 130/85 100/71 116/73  Pulse: 87 82 83 65  Resp: 16 16 16 16   Temp: 98 F (36.7 C) 98.1 F (36.7 C) 98.1 F (36.7 C) 98.2 F (36.8 C)  TempSrc: Oral Oral Oral Oral  SpO2: 99% 100% 98% 98%   Recent Labs    03/20/23 1415 03/21/23 0430  WBC 7.0 12.8*  HGB 11.3* 10.4*  HCT 34.2* 31.5*  PLT 217 225    Blood type: --/--/O POS (04/12 1409)  Rubella: Immune (10/09 0000)   I&O: I/O last 3 completed shifts: In: -  Out: 50 [Blood:50]          No intake/output data recorded.  Gen: AAO x 3, NAD Abdomen: soft, non-tender, non-distended Fundus: firm, non-tender, U-1 Perineum: intact Lochia: small Extremities: no edema, no calf pain or tenderness   A/P:  PPD # 1 32 y.o., [id number]  Principal Problem:   Postpartum care following vaginal delivery 4/12  Doing well - stable status  Routine post partum orders Active Problems:   Normal labor   History of depression  Stable   Plan close PP F/U   SVD (spontaneous vaginal delivery)  Anticipate discharge tomorrow.   June Leap, MSN, CNM 03/21/2023, 9:04 AM

## 2023-03-21 NOTE — Lactation Note (Signed)
This note was copied from a baby's chart. Lactation Consultation Note  Patient Name: Misty Ayers [id number] Date: 03/21/2023 Age:30 hours Reason for consult: Initial assessment;Early term 37-38.6wks Experienced BF mom stated BF going well. Baby on the breast when LC entered rm. Baby had good body alignment and feeding well. LC suggested mom BF STS so mom un-swaddled baby and laind cover over outside of baby. Newborn feeding habits, STS, I&O props reviewed. Gave mom Plan of Care crib card and discussed BF babies less than 6 lbs. Mom would like to give her BM. Mom will start supplementing tomorrow after pumping. Mom encouraged to feed baby 8-12 times/24 hours and with feeding cues.  Mom has #21 flanges. Mom stated she measured and stated that is the size she needed. Encouraged mom to call for assistance as needed.  Maternal Data Does the patient have breastfeeding experience prior to this delivery?: Yes How long did the patient breastfeed?: 19 months  Feeding    LATCH Score Latch: Grasps breast easily, tongue down, lips flanged, rhythmical sucking.  Audible Swallowing: A few with stimulation  Type of Nipple: Everted at rest and after stimulation  Comfort (Breast/Nipple): Soft / non-tender  Hold (Positioning): No assistance needed to correctly position infant at breast.  LATCH Score: 9   Lactation Tools Discussed/Used Tools: Flanges;Pump Flange Size: 21 Breast pump type: Double-Electric Breast Pump Reason for Pumping: less than 6 lbs Pumping frequency: q3hr (mom is to tired to pump tonight will start in am.)  Interventions Interventions: Breast feeding basics reviewed;DEBP;Support pillows;Skin to skin;Position options;LC Services brochure;Breast compression  Discharge    Consult Status Consult Status: Follow-up Date: 03/22/23 Follow-up type: In-patient    Charyl Dancer 03/21/2023, 2:51 AM

## 2023-03-21 NOTE — Discharge Summary (Signed)
OB Discharge Summary  Patient Name: Misty Ayers DOB: 1991-09-27 MRN: [id number]  Date of admission: 03/20/2023 Delivering provider: Dorisann Frames K   Admitting diagnosis: Normal labor [O80, Z37.9] Intrauterine pregnancy: [redacted]w[redacted]d     Secondary diagnosis: Patient Active Problem List   Diagnosis Date Noted   Normal labor 03/20/2023   History of depression 03/20/2023   SVD (spontaneous vaginal delivery) 03/20/2023   Postpartum care following vaginal delivery 4/12 03/20/2023    Date of discharge: 03/21/2023   Discharge diagnosis: Principal Problem:   Postpartum care following vaginal delivery 4/12 Active Problems:   Normal labor   History of depression   SVD (spontaneous vaginal delivery)                                                           Augmentation: AROM Pain control: None  Laceration:1st degree  Complications: None  Hospital course:  Onset of Labor With Vaginal Delivery      32 y.o. yo [id number] at [redacted]w[redacted]d was admitted in Active Labor on 03/20/2023.  Membrane Rupture Time/Date: 2:32 PM ,03/20/2023   Delivery Method:Vaginal, Spontaneous  Episiotomy: None  Lacerations:  1st degree  Patient had an uncomplicated postpartum course. She is ambulating, tolerating a regular diet, passing flatus, and urinating well. Patient is discharged home in stable condition on 03/21/23.  Newborn Data: Birth date:03/20/2023  Birth time:7:13 PM  Gender:Female  Living status:Living  Apgars:9 ,9  Weight:2550 g   Physical exam  Vitals:   03/20/23 2210 03/21/23 0115 03/21/23 0419 03/21/23 0805  BP: 130/85 100/71 116/73 121/87  Pulse: 82 83 65 86  Resp: 16 16 16 18   Temp: 98.1 F (36.7 C) 98.1 F (36.7 C) 98.2 F (36.8 C) 98 F (36.7 C)  TempSrc: Oral Oral Oral Oral  SpO2: 100% 98% 98% 99%   General: alert, cooperative, and no distress Lochia: appropriate Uterine Fundus: firm Perineum: hemostatic first degree DVT Evaluation: No evidence of DVT seen on physical  exam.  Labs: Lab Results  Component Value Date   WBC 12.8 (H) 03/21/2023   HGB 10.4 (L) 03/21/2023   HCT 31.5 (L) 03/21/2023   MCV 81.8 03/21/2023   PLT 225 03/21/2023      03/21/2023    1:39 AM 03/15/2021    9:30 AM  Edinburgh Postnatal Depression Scale Screening Tool  I have been able to laugh and see the funny side of things. 0 0  I have looked forward with enjoyment to things. 0 0  I have blamed myself unnecessarily when things went wrong. 0 1  I have been anxious or worried for no good reason. 0 2  I have felt scared or panicky for no good reason. 0 0  Things have been getting on top of me. 1 0  I have been so unhappy that I have had difficulty sleeping. 0 0  I have felt sad or miserable. 0 0  I have been so unhappy that I have been crying. 0 0  The thought of harming myself has occurred to me. 0 0  Edinburgh Postnatal Depression Scale Total 1 3   Discharge instructions:  per After Visit Summary  After Visit Meds:  Allergies as of 03/21/2023       Reactions   Penicillins Rash  Medication List     STOP taking these medications    acetaminophen 500 MG tablet Commonly known as: TYLENOL   fluconazole 150 MG tablet Commonly known as: DIFLUCAN   polyethylene glycol powder 17 GM/SCOOP powder Commonly known as: GLYCOLAX/MIRALAX       TAKE these medications    prenatal multivitamin Tabs tablet Take 1 tablet by mouth daily at 12 noon.       Activity: Advance as tolerated. Pelvic rest for 6 weeks.   Newborn Data: Live born female  Birth Weight: 5 lb 10 oz (2550 g) APGAR: 9, 9  Newborn Delivery   Birth date/time: 03/20/2023 19:13:00 Delivery type: Vaginal, Spontaneous      Named Misty Ayers Baby Feeding: Breast Circumcision: Completed/Dr. Juliene Pina Disposition:home with mother  Delivery Report:  Review the Delivery Report for details.    Follow up:  Follow-up Information     June Leap, CNM. Schedule an appointment as soon as possible for a  visit in 6 week(s).   Specialty: Certified Nurse Midwife Contact information: [address] [place] [address] [phone]                June Leap, CNM, MSN 03/21/2023, 12:00 PM

## 2023-03-21 NOTE — Progress Notes (Signed)
CSW received consult for hx of Anxiety and Depression. CSW met with MOB to offer support and complete assessment. CSW met with MOB at bedside to complete assessment. When CSW entered room, MOB was observed sitting in hospital bed. MOB's parents were present, holding infant. CSW introduced self and requested to speak with MOB alone. MOB's parents left room. CSW explained reason for consult. MOB was agreeable to consult and remained engaged throughout encounter.   CSW inquired about MOB's mental health history. MOB reports she was first diagnosed with anxiety during college. CSW inquired about symptoms of depression or anxiety during pregnancy. MOB denied mental health symptoms during pregnancy and since infant's arrival. CSW inquired if MOB experienced symptoms of postpartum depression after the birth of her other child. MOB reports she did endorse symptoms of postpartum depression and anxiety after her daughter was born, marked by "OCD thoughts", sharing that she endorsed the fear of her and her daughter dying and recalls crying often. MOB also recalls experiencing "sundowning", describing herself feeling anxious about nighttime, and shares that she would feel overwhelmed feel afraid, and cry often in the evenings. MOB shares that she was prescribed sertraline until her daughter was about 15 months old as treatment for postpartum depression and anxiety symptoms, which she shares was helpful at the time. MOB recalls the medication stopped working as well towards the end of her taking it, sharing that she felt better after discontinuing the medication after her symptoms of postpartum depression and anxiety resolved. 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 MOB states she does not currently take psychotropic medication and is not currently in therapy;  however, MOB shares that she has access to [facility] which she has utilized in the past and shares she plans to utilize therapy services if she is in need of additional support postpartum. MOB identified FOB, her parents, her in-laws and her siblings as supports. MOB denied current SI/HI/DV.   CSW provided education regarding the baby blues period vs. perinatal mood disorders, discussed treatment and gave resources for mental health follow up if concerns arise.  CSW recommends self-evaluation during the postpartum time period using the New Mom Checklist from Postpartum Progress and encouraged MOB to contact a medical professional if symptoms are noted at any time.    MOB reports she has all needed items for infant, including a car seat and bassinet. MOB has chosen [facility] for infant's follow up care.  MOB declined review of Sudden Infant Death Syndrome (SIDS) precautions, sharing she is aware of safe sleep practices.  CSW identifies no further need for intervention and no barriers to discharge at this time.  Signed,  Sherron Mummert K. Najla Aughenbaugh, MSW, LCSWA, LCASA 03/21/2023 5:03 PM 

## 2023-03-21 NOTE — Lactation Note (Signed)
This note was copied from a baby's chart. Lactation Consultation Note  Patient Name: Misty Ayers [id number] Date: 03/21/2023 Age:30 hours Reason for consult: Follow-up assessment;Early term 37-38.6wks;Infant < 6lbs Mom packing up getting ready to leave for d/c home. Mom stated BF going very well and showed LC the bottle of colostrum she pumped. Praised mom. Reminded mom of LC OP services and support groups. Experienced BF mom stated she doesn't have any questions or concerns.  Maternal Data    Feeding    LATCH Score                    Lactation Tools Discussed/Used    Interventions    Discharge    Consult Status Consult Status: Complete Date: 03/21/23    Charyl Dancer 03/21/2023, 8:38 PM

## 2023-03-31 ENCOUNTER — Telehealth (HOSPITAL_COMMUNITY): Payer: Self-pay

## 2023-03-31 NOTE — Telephone Encounter (Signed)
Patient did not answer phone call. Voicemail left for patient.   Suann Larry [facility]   03/31/23,1303

## 2023-06-25 LAB — HM PAP SMEAR

## 2023-06-25 LAB — RESULTS CONSOLE HPV: CHL HPV: NEGATIVE

## 2023-08-11 ENCOUNTER — Ambulatory Visit (INDEPENDENT_AMBULATORY_CARE_PROVIDER_SITE_OTHER): Payer: BC Managed Care – PPO

## 2023-08-11 ENCOUNTER — Ambulatory Visit: Payer: BC Managed Care – PPO | Admitting: Family Medicine

## 2023-08-11 ENCOUNTER — Encounter: Payer: Self-pay | Admitting: Family Medicine

## 2023-08-11 ENCOUNTER — Telehealth: Payer: Self-pay | Admitting: Family Medicine

## 2023-08-11 DIAGNOSIS — M25512 Pain in left shoulder: Secondary | ICD-10-CM

## 2023-08-11 DIAGNOSIS — M79661 Pain in right lower leg: Secondary | ICD-10-CM | POA: Diagnosis not present

## 2023-08-11 MED ORDER — NAPROXEN 500 MG PO TABS
500.0000 mg | ORAL_TABLET | Freq: Two times a day (BID) | ORAL | 0 refills | Status: DC
Start: 2023-08-11 — End: 2023-08-11

## 2023-08-11 MED ORDER — CYCLOBENZAPRINE HCL 10 MG PO TABS: 10.0000 mg | ORAL_TABLET | Freq: Three times a day (TID) | ORAL | 0 refills | Status: DC | PRN

## 2023-08-11 MED ORDER — NAPROXEN 500 MG PO TABS
500.0000 mg | ORAL_TABLET | Freq: Two times a day (BID) | ORAL | 0 refills | Status: DC
Start: 2023-08-11 — End: 2023-12-28

## 2023-08-11 MED ORDER — CYCLOBENZAPRINE HCL 10 MG PO TABS
10.0000 mg | ORAL_TABLET | Freq: Three times a day (TID) | ORAL | 0 refills | Status: DC | PRN
Start: 2023-08-11 — End: 2023-08-11

## 2023-08-11 NOTE — Telephone Encounter (Signed)
Pt spouse called. RXs today were sent to the wrong pharmacy. Please resend to   [facility] [phone] - [place], [place] - [address]

## 2023-08-11 NOTE — Telephone Encounter (Signed)
RX's have been resent by provider to [facility] [address] [place]. Dm/cma

## 2023-08-11 NOTE — Addendum Note (Signed)
Addended by: Loyola Mast on: 08/11/2023 04:36 PM   Modules accepted: Orders
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
## 2023-08-24 ENCOUNTER — Ambulatory Visit (INDEPENDENT_AMBULATORY_CARE_PROVIDER_SITE_OTHER): Payer: BC Managed Care – PPO

## 2023-08-24 ENCOUNTER — Encounter: Payer: Self-pay | Admitting: Family Medicine

## 2023-08-24 ENCOUNTER — Ambulatory Visit: Payer: BC Managed Care – PPO | Admitting: Family Medicine

## 2023-08-24 VITALS — BP 130/78 | HR 72 | Temp 97.9°F | Ht 62.5 in | Wt 135.0 lb

## 2023-08-24 DIAGNOSIS — F900 Attention-deficit hyperactivity disorder, predominantly inattentive type: Secondary | ICD-10-CM | POA: Diagnosis not present

## 2023-08-24 DIAGNOSIS — M79661 Pain in right lower leg: Secondary | ICD-10-CM

## 2023-08-24 MED ORDER — AMPHETAMINE-DEXTROAMPHETAMINE 10 MG PO TABS
10.0000 mg | ORAL_TABLET | Freq: Every day | ORAL | 0 refills | Status: DC
Start: 2023-08-24 — End: 2023-09-28

## 2023-08-24 NOTE — Assessment & Plan Note (Signed)
We discussed risk/benefits of Adderall use with breastfeeding. I will start her on Adderall 10 mg daily as a trial. She plans to take this during the morning hours. She will avoid use of the breast milk from the four hour window from when she takes the medicine. I will see her back in 1 month to assess if the medicine improved her focus.
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
## 2023-09-28 ENCOUNTER — Ambulatory Visit: Payer: BC Managed Care – PPO | Admitting: Family Medicine

## 2023-09-28 ENCOUNTER — Encounter: Payer: Self-pay | Admitting: Family Medicine

## 2023-09-28 VITALS — BP 124/78 | HR 82 | Temp 98.1°F | Ht 62.5 in | Wt 130.6 lb

## 2023-09-28 DIAGNOSIS — F900 Attention-deficit hyperactivity disorder, predominantly inattentive type: Secondary | ICD-10-CM

## 2023-09-28 DIAGNOSIS — S8011XD Contusion of right lower leg, subsequent encounter: Secondary | ICD-10-CM | POA: Diagnosis not present

## 2023-09-28 MED ORDER — AMPHETAMINE-DEXTROAMPHETAMINE 10 MG PO TABS: 10.0000 mg | ORAL_TABLET | Freq: Every day | ORAL | 0 refills | Status: DC
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
## 2023-09-28 NOTE — Assessment & Plan Note (Signed)
Improved focus and task completion on Adderall. Continue Adderall 10 mg daily on Tu-Th.

## 2023-10-12 ENCOUNTER — Ambulatory Visit: Payer: BC Managed Care – PPO | Attending: Family Medicine

## 2023-11-04 ENCOUNTER — Other Ambulatory Visit: Payer: Self-pay | Admitting: Family Medicine

## 2023-11-04 DIAGNOSIS — F900 Attention-deficit hyperactivity disorder, predominantly inattentive type: Secondary | ICD-10-CM

## 2023-11-04 MED ORDER — AMPHETAMINE-DEXTROAMPHETAMINE 10 MG PO TABS: 10.0000 mg | ORAL_TABLET | Freq: Every day | ORAL | 0 refills | Status: DC

## 2023-11-09 ENCOUNTER — Other Ambulatory Visit: Payer: Self-pay | Admitting: Family Medicine

## 2023-11-09 DIAGNOSIS — F900 Attention-deficit hyperactivity disorder, predominantly inattentive type: Secondary | ICD-10-CM

## 2023-11-09 MED ORDER — AMPHETAMINE-DEXTROAMPHETAMINE 10 MG PO TABS: 10.0000 mg | ORAL_TABLET | Freq: Every day | ORAL | 0 refills | Status: DC

## 2023-11-09 NOTE — Telephone Encounter (Signed)
Refill request  amphetamine-dextroamphetamine (ADDERALL) 10 MG tablet [[id number]]   [id number] - [place], [place] - [address] [place], [place] [address] Phone: [phone]  Fax: [phone]

## 2023-11-09 NOTE — Telephone Encounter (Signed)
Refill request for  Adderall 10 mg LR  11/04/23, #15, 0 rf LOV  09/28/23 FOV  12/28/23  Please review and advise.  Thanks. Dm/cma

## 2023-11-10 NOTE — Telephone Encounter (Signed)
Patient notified VIA phone. Dm/cma  

## 2023-11-11 ENCOUNTER — Ambulatory Visit: Payer: BC Managed Care – PPO | Admitting: Nurse Practitioner

## 2023-11-11 ENCOUNTER — Encounter: Payer: Self-pay | Admitting: Nurse Practitioner

## 2023-11-11 VITALS — BP 121/97 | HR 96 | Resp 18 | Wt 133.4 lb

## 2023-11-11 DIAGNOSIS — H66003 Acute suppurative otitis media without spontaneous rupture of ear drum, bilateral: Secondary | ICD-10-CM | POA: Diagnosis not present

## 2023-11-11 DIAGNOSIS — J0100 Acute maxillary sinusitis, unspecified: Secondary | ICD-10-CM | POA: Diagnosis not present

## 2023-11-11 MED ORDER — CLINDAMYCIN HCL 300 MG PO CAPS: 300.0000 mg | ORAL_CAPSULE | Freq: Three times a day (TID) | ORAL | 0 refills | Status: DC

## 2023-11-11 MED ORDER — GUAIFENESIN ER 600 MG PO TB12
600.0000 mg | ORAL_TABLET | Freq: Two times a day (BID) | ORAL | Status: DC | PRN
Start: 2023-11-11 — End: 2023-12-28

## 2023-11-11 MED ORDER — FLUTICASONE PROPIONATE 50 MCG/ACT NA SUSP
2.0000 | Freq: Every day | NASAL | 0 refills | Status: DC
Start: 2023-11-11 — End: 2023-12-28

## 2023-11-11 NOTE — Patient Instructions (Addendum)
Return to office if no improvement in 7-10days. Stop oral decongestant Start flonase nasal spray.

## 2023-11-11 NOTE — Progress Notes (Signed)
Established Patient Visit  Patient: Misty Ayers   DOB: 05/24/1991   32 y.o. Female  MRN: [id number] Visit Date: 11/11/2023  Subjective:    Chief Complaint  Patient presents with   Acute Visit    PT C/O of left ear concerns with decrease in hearing and painful since last night with congestion for 1 week.    Otalgia  There is pain in the left ear. This is a new problem. The current episode started yesterday. The problem occurs constantly. The problem has been unchanged. There has been no fever. Associated symptoms include hearing loss and rhinorrhea. Pertinent negatives include no abdominal pain, coughing, diarrhea, ear discharge, headaches, neck pain, rash, sore throat or vomiting. She has tried acetaminophen (decongestant) for the symptoms. The treatment provided no relief. There is no history of a chronic ear infection, hearing loss or a tympanostomy tube.  Sick contact at work and at home. PCN allergy-rash, currently breastfeeding Unsure about previous use of a cephalosporin  Reviewed medical, surgical, and social history today  Medications: Outpatient Medications Prior to Visit  Medication Sig   amphetamine-dextroamphetamine (ADDERALL) 10 MG tablet Take 1 tablet (10 mg total) by mouth daily.   cyclobenzaprine (FLEXERIL) 10 MG tablet Take 1 tablet (10 mg total) by mouth 3 (three) times daily as needed for muscle spasms. (Patient not taking: Reported on 11/11/2023)   naproxen (NAPROSYN) 500 MG tablet Take 1 tablet (500 mg total) by mouth 2 (two) times daily with a meal. (Patient not taking: Reported on 11/11/2023)   Prenatal Vit-Fe Fumarate-FA (PRENATAL MULTIVITAMIN) TABS tablet Take 1 tablet by mouth daily at 12 noon. (Patient not taking: Reported on 11/11/2023)   No facility-administered medications prior to visit.   Reviewed past medical and social history.   ROS per HPI above      Objective:  BP (!) 121/97 (BP Location: Right Arm, Patient Position: Sitting,  Cuff Size: Normal) Comment: recheck BP reading  Pulse 96   Resp 18   Wt 133 lb 6.4 oz (60.5 kg)   SpO2 98%   Breastfeeding Yes   BMI 24.01 kg/m      Physical Exam Vitals and nursing note reviewed.  HENT:     Right Ear: Decreased hearing noted. No drainage, swelling or tenderness. A middle ear effusion is present. There is no impacted cerumen. No foreign body. No mastoid tenderness. Tympanic membrane is injected, erythematous and retracted. Tympanic membrane is not scarred, perforated or bulging. Tympanic membrane has normal mobility.     Left Ear: Decreased hearing noted. No drainage, swelling or tenderness. A middle ear effusion is present. There is no impacted cerumen. No foreign body. No mastoid tenderness. Tympanic membrane is injected, erythematous and retracted. Tympanic membrane is not scarred, perforated or bulging. Tympanic membrane has normal mobility.     Nose: Mucosal edema and congestion present.     Right Nostril: No occlusion.     Left Nostril: No occlusion.     Right Turbinates: Swollen.     Left Turbinates: Swollen.     Right Sinus: Maxillary sinus tenderness and frontal sinus tenderness present.     Left Sinus: Maxillary sinus tenderness and frontal sinus tenderness present.  Cardiovascular:     Rate and Rhythm: Normal rate.     Pulses: Normal pulses.  Pulmonary:     Effort: Pulmonary effort is normal.  Neurological:     Mental Status: She is alert and  oriented to person, place, and time.     No results found for any visits on 11/11/23.    Assessment & Plan:    Problem List Items Addressed This Visit   None Visit Diagnoses     Non-recurrent acute suppurative otitis media of both ears without spontaneous rupture of tympanic membranes    -  Primary   Relevant Medications   fluticasone (FLONASE) 50 MCG/ACT nasal spray   guaiFENesin (MUCINEX) 600 MG 12 hr tablet   clindamycin (CLEOCIN) 300 MG capsule   Acute non-recurrent maxillary sinusitis       Relevant  Medications   fluticasone (FLONASE) 50 MCG/ACT nasal spray   guaiFENesin (MUCINEX) 600 MG 12 hr tablet   clindamycin (CLEOCIN) 300 MG capsule     RTO if no improvement in 7-10days. Advised not to insert anything into ears  Return if symptoms worsen or fail to improve.     Alysia Penna, NP

## 2023-12-28 ENCOUNTER — Ambulatory Visit: Payer: BC Managed Care – PPO | Admitting: Family Medicine

## 2023-12-28 VITALS — BP 124/80 | HR 80 | Temp 98.1°F | Ht 62.5 in | Wt 132.6 lb

## 2023-12-28 DIAGNOSIS — F900 Attention-deficit hyperactivity disorder, predominantly inattentive type: Secondary | ICD-10-CM | POA: Diagnosis not present

## 2023-12-28 DIAGNOSIS — S8011XD Contusion of right lower leg, subsequent encounter: Secondary | ICD-10-CM

## 2023-12-28 MED ORDER — AMPHETAMINE-DEXTROAMPHETAMINE 10 MG PO TABS: 10.0000 mg | ORAL_TABLET | Freq: Every day | ORAL | 0 refills | Status: DC
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
## 2023-12-28 NOTE — Assessment & Plan Note (Signed)
Improved focus and task completion on Adderall. Continue Adderall 10 mg daily four days per week.

## 2023-12-29 ENCOUNTER — Encounter: Payer: Self-pay | Admitting: Family Medicine

## 2024-02-01 ENCOUNTER — Other Ambulatory Visit: Payer: Self-pay | Admitting: Family Medicine

## 2024-02-01 DIAGNOSIS — F900 Attention-deficit hyperactivity disorder, predominantly inattentive type: Secondary | ICD-10-CM

## 2024-02-01 MED ORDER — AMPHETAMINE-DEXTROAMPHETAMINE 10 MG PO TABS: 10.0000 mg | ORAL_TABLET | Freq: Every day | ORAL | 0 refills | Status: DC

## 2024-02-01 NOTE — Telephone Encounter (Signed)
 Refill request for  Adderall  10 mg LR  12/28/23, #20, 0 rf LOV  12/28/23 FOV  06/27/24  Please review and advise.  Thanks. Dm/cma

## 2024-03-16 ENCOUNTER — Other Ambulatory Visit: Payer: Self-pay | Admitting: Family Medicine

## 2024-03-16 DIAGNOSIS — F900 Attention-deficit hyperactivity disorder, predominantly inattentive type: Secondary | ICD-10-CM

## 2024-03-16 MED ORDER — AMPHETAMINE-DEXTROAMPHETAMINE 10 MG PO TABS
10.0000 mg | ORAL_TABLET | Freq: Every day | ORAL | 0 refills | Status: DC
Start: 2024-03-16 — End: 2024-04-19

## 2024-03-16 NOTE — Telephone Encounter (Signed)
 Requesting: amphetamine-dextroamphetamine (ADDERALL) 10 MG tablet  Last Visit: 12/28/2023 Next Visit: 06/27/2024 Last Refill: 02/01/2024  Please Advise

## 2024-04-19 ENCOUNTER — Other Ambulatory Visit: Payer: Self-pay | Admitting: Family Medicine

## 2024-04-19 DIAGNOSIS — F900 Attention-deficit hyperactivity disorder, predominantly inattentive type: Secondary | ICD-10-CM

## 2024-04-19 MED ORDER — AMPHETAMINE-DEXTROAMPHETAMINE 10 MG PO TABS
10.0000 mg | ORAL_TABLET | Freq: Every day | ORAL | 0 refills | Status: DC
Start: 2024-04-19 — End: 2024-05-30

## 2024-04-19 NOTE — Telephone Encounter (Signed)
 Requesting: amphetamine -dextroamphetamine  (ADDERALL) 10 MG tablet  Last Visit: 12/28/2023 Next Visit: 06/27/2024 Last Refill: 03/16/2024  Please Advise

## 2024-05-30 ENCOUNTER — Encounter: Payer: Self-pay | Admitting: Family Medicine

## 2024-05-30 DIAGNOSIS — F900 Attention-deficit hyperactivity disorder, predominantly inattentive type: Secondary | ICD-10-CM

## 2024-05-30 MED ORDER — AMPHETAMINE-DEXTROAMPHETAMINE 10 MG PO TABS: 10.0000 mg | ORAL_TABLET | Freq: Every day | ORAL | 0 refills | Status: DC

## 2024-05-30 NOTE — Telephone Encounter (Signed)
 Refill request for  Adderall 10 mg LR  04/19/24,#20, 0 rf LOV  12/28/23 FOV 06/27/24  Please review and advise.  Thanks. Dm/cma

## 2024-06-27 ENCOUNTER — Ambulatory Visit: Payer: BC Managed Care – PPO | Admitting: Family Medicine

## 2024-06-27 ENCOUNTER — Encounter: Payer: Self-pay | Admitting: Family Medicine

## 2024-06-27 ENCOUNTER — Ambulatory Visit: Payer: Self-pay | Admitting: Family Medicine

## 2024-06-27 VITALS — BP 116/72 | HR 83 | Temp 96.9°F | Ht 62.5 in | Wt 126.6 lb

## 2024-06-27 DIAGNOSIS — Z862 Personal history of diseases of the blood and blood-forming organs and certain disorders involving the immune mechanism: Secondary | ICD-10-CM | POA: Diagnosis not present

## 2024-06-27 DIAGNOSIS — Z13220 Encounter for screening for lipoid disorders: Secondary | ICD-10-CM

## 2024-06-27 DIAGNOSIS — Z111 Encounter for screening for respiratory tuberculosis: Secondary | ICD-10-CM | POA: Diagnosis not present

## 2024-06-27 DIAGNOSIS — F900 Attention-deficit hyperactivity disorder, predominantly inattentive type: Secondary | ICD-10-CM

## 2024-06-27 LAB — CBC
HCT: 38.2 % (ref 36.0–46.0)
Hemoglobin: 12.5 g/dL (ref 12.0–15.0)
MCHC: 32.6 g/dL (ref 30.0–36.0)
MCV: 87.5 fl (ref 78.0–100.0)
Platelets: 241 K/uL (ref 150.0–400.0)
RBC: 4.37 Mil/uL (ref 3.87–5.11)
RDW: 13.2 % (ref 11.5–15.5)
WBC: 3.3 K/uL — ABNORMAL LOW (ref 4.0–10.5)

## 2024-06-27 LAB — LIPID PANEL
Cholesterol: 202 mg/dL — ABNORMAL HIGH (ref 0–200)
HDL: 80.3 mg/dL (ref >39.00)
LDL Cholesterol: 99 mg/dL (ref 0–99)
NonHDL: 121.38
Total CHOL/HDL Ratio: 3
Triglycerides: 110 mg/dL (ref 0.0–149.0)
VLDL: 22 mg/dL (ref 0.0–40.0)

## 2024-06-27 MED ORDER — AMPHETAMINE-DEXTROAMPHETAMINE 10 MG PO TABS: 10.0000 mg | ORAL_TABLET | Freq: Every day | ORAL | 0 refills | Status: DC

## 2024-06-27 NOTE — Progress Notes (Signed)
 [facility] [address] [place] [address] Dept: [phone] Dept Fax: [phone]  Chronic Care Office Visit  Subjective:    Patient ID: Misty Ayers, female    DOB: 27-Nov-1991, 33 y.o..   MRN: [id number]  Chief Complaint  Patient presents with   Depression   ADHD    Follow up, complete physical forms, request labs   History of Present Illness:  Patient is in today for reassessment of chronic medical issues.  Ms. Wnek has a history of ADHD. We are managing her on Adderall 10 mg for issues of focus and completing her work. She takes this 2-4 days a week depending on her work load.She ahs plans to try and transition off of this at some point. She is planning to start back to school int he Fall at [employer] to work on her DNP.  Past Medical History: Patient Active Problem List   Diagnosis Date Noted   History of depression 03/20/2023   Attention deficit hyperactivity disorder (ADHD) 01/12/2019   Past Surgical History:  Procedure Laterality Date   LEEP     Family History  Problem Relation Age of Onset   Kidney disease Mother    Stroke Mother    Hyperlipidemia Mother    Hypertension Mother    Mental illness Mother    Hypertension Father    Stroke Brother    Heart attack Maternal Grandfather    Outpatient Medications Prior to Visit  Medication Sig Dispense Refill   amphetamine -dextroamphetamine  (ADDERALL) 10 MG tablet Take 1 tablet (10 mg total) by mouth daily. 20 tablet 0   clindamycin  (CLEOCIN ) 300 MG capsule Take 1 capsule (300 mg total) by mouth 3 (three) times daily. 21 capsule 0   cyclobenzaprine  (FLEXERIL ) 10 MG tablet Take 1 tablet (10 mg total) by mouth 3 (three) times daily as needed for muscle spasms. 30 tablet 0   No facility-administered medications prior to visit.   Allergies  Allergen Reactions   Penicillins Rash   Objective:   Today's Vitals   06/27/24 0816  BP: 116/72  Pulse: 83   Temp: (!) 96.9 F (36.1 C)  SpO2: 99%  Weight: 126 lb 9.6 oz (57.4 kg)  Height: 5' 2.5 (1.588 m)   Body mass index is 22.79 kg/m.   General: Well developed, well nourished. No acute distress. Psych: Alert and oriented. Normal mood and affect.  Health Maintenance Due  Topic Date Due   HPV VACCINES (1 - 3-dose SCDM series) Never done     Assessment & Plan:   Problem List Items Addressed This Visit       Other   Attention deficit hyperactivity disorder (ADHD) - Primary   Improved focus and task completion on Adderall. Continue Adderall 10 mg daily up to four days per week.      Relevant Medications   amphetamine -dextroamphetamine  (ADDERALL) 10 MG tablet   Other Visit Diagnoses       Screening for tuberculosis       Will check [employer] for school requirements (DNP program).   Relevant Orders   QuantiFERON-TB Gold Plus     History of anemia       Prior anemia after last pregnancy. We will reassess.   Relevant Orders   CBC     Screening for lipid disorders       Reports priro history of elevated cholesterol.   Relevant Orders   Lipid panel       Return in about 6  months (around 12/28/2024) for Reassessment.   Garnette CHRISTELLA Simpler, MD

## 2024-06-27 NOTE — Assessment & Plan Note (Signed)
 Improved focus and task completion on Adderall. Continue Adderall 10 mg daily up to four days per week.

## 2024-06-29 LAB — QUANTIFERON-TB GOLD PLUS
Mitogen-NIL: >10 [IU]/mL
NIL: 0.02 [IU]/mL
QuantiFERON-TB Gold Plus: NEGATIVE
TB1-NIL: <0 [IU]/mL
TB2-NIL: 0 [IU]/mL

## 2024-06-30 ENCOUNTER — Telehealth: Payer: Self-pay

## 2024-06-30 NOTE — Telephone Encounter (Signed)
 Left VM that paperwork for [facility] was ready for pickup at front desk.

## 2024-07-25 ENCOUNTER — Encounter: Payer: Self-pay | Admitting: Family Medicine

## 2024-07-25 DIAGNOSIS — F900 Attention-deficit hyperactivity disorder, predominantly inattentive type: Secondary | ICD-10-CM

## 2024-07-25 MED ORDER — AMPHETAMINE-DEXTROAMPHETAMINE 10 MG PO TABS: 10.0000 mg | ORAL_TABLET | Freq: Every day | ORAL | 0 refills | Status: DC

## 2024-07-25 NOTE — Telephone Encounter (Signed)
 Refill request for  Adderall 10 mg LR  06/27/24, #20, 0 rf LOV  06/27/24 FOV 12/20/23  Please review and advise.  Thanks. Dm/cma

## 2024-08-31 ENCOUNTER — Encounter: Payer: Self-pay | Admitting: Family Medicine

## 2024-08-31 DIAGNOSIS — F900 Attention-deficit hyperactivity disorder, predominantly inattentive type: Secondary | ICD-10-CM

## 2024-08-31 MED ORDER — AMPHETAMINE-DEXTROAMPHETAMINE 10 MG PO TABS: 10.0000 mg | ORAL_TABLET | Freq: Every day | ORAL | 0 refills | Status: DC

## 2024-08-31 NOTE — Telephone Encounter (Signed)
 Refill request for Adderall 10 mg LR 07/25/24, #20, 0 rf LOV  06/27/24 FOV  12/19/24  Please review and advise.  Thanks. Dm/cma

## 2024-11-18 ENCOUNTER — Encounter: Admitting: Certified Nurse Midwife

## 2024-12-14 ENCOUNTER — Inpatient Hospital Stay (HOSPITAL_COMMUNITY)
Admission: AD | Admit: 2024-12-14 | Discharge: 2024-12-14 | Disposition: A | Discharge status: Home / Self Care | Attending: Obstetrics & Gynecology | Admitting: Obstetrics & Gynecology

## 2024-12-14 ENCOUNTER — Encounter (HOSPITAL_COMMUNITY): Payer: Self-pay

## 2024-12-14 ENCOUNTER — Other Ambulatory Visit: Payer: Self-pay

## 2024-12-14 DIAGNOSIS — M7918 Myalgia, other site: Secondary | ICD-10-CM | POA: Diagnosis not present

## 2024-12-14 DIAGNOSIS — Z3A15 15 weeks gestation of pregnancy: Secondary | ICD-10-CM | POA: Diagnosis not present

## 2024-12-14 DIAGNOSIS — O26892 Other specified pregnancy related conditions, second trimester: Secondary | ICD-10-CM | POA: Diagnosis not present

## 2024-12-14 DIAGNOSIS — R002 Palpitations: Secondary | ICD-10-CM | POA: Diagnosis not present

## 2024-12-14 DIAGNOSIS — O99891 Other specified diseases and conditions complicating pregnancy: Secondary | ICD-10-CM | POA: Insufficient documentation

## 2024-12-14 DIAGNOSIS — R0602 Shortness of breath: Secondary | ICD-10-CM | POA: Diagnosis present

## 2024-12-14 LAB — CBC WITH DIFFERENTIAL/PLATELET
Abs Immature Granulocytes: 0.02 K/uL (ref 0.00–0.07)
Basophils Absolute: 0 K/uL (ref 0.0–0.1)
Basophils Relative: 0 %
Eosinophils Absolute: 0 K/uL (ref 0.0–0.5)
Eosinophils Relative: 0 %
HCT: 33.5 % — ABNORMAL LOW (ref 36.0–46.0)
Hemoglobin: 11.3 g/dL — ABNORMAL LOW (ref 12.0–15.0)
Immature Granulocytes: 0 %
Lymphocytes Relative: 11 %
Lymphs Abs: 0.7 K/uL (ref 0.7–4.0)
MCH: 27.8 pg (ref 26.0–34.0)
MCHC: 33.7 g/dL (ref 30.0–36.0)
MCV: 82.3 fL (ref 80.0–100.0)
Monocytes Absolute: 0.4 K/uL (ref 0.1–1.0)
Monocytes Relative: 7 %
Neutro Abs: 5.2 K/uL (ref 1.7–7.7)
Neutrophils Relative %: 82 %
Platelets: 287 K/uL (ref 150–400)
RBC: 4.07 MIL/uL (ref 3.87–5.11)
RDW: 12.8 % (ref 11.5–15.5)
WBC: 6.3 K/uL (ref 4.0–10.5)
nRBC: 0 % (ref 0.0–0.2)

## 2024-12-14 LAB — COMPREHENSIVE METABOLIC PANEL WITH GFR
ALT: 5 U/L (ref 0–44)
AST: 15 U/L (ref 15–41)
Albumin: 3.9 g/dL (ref 3.5–5.0)
Alkaline Phosphatase: 45 U/L (ref 38–126)
Anion gap: 12 (ref 5–15)
BUN: 9 mg/dL (ref 6–20)
CO2: 19 mmol/L — ABNORMAL LOW (ref 22–32)
Calcium: 9.1 mg/dL (ref 8.9–10.3)
Chloride: 103 mmol/L (ref 98–111)
Creatinine, Ser: 0.64 mg/dL (ref 0.44–1.00)
GFR, Estimated: >60 mL/min
Glucose, Bld: 79 mg/dL (ref 70–99)
Potassium: 3.7 mmol/L (ref 3.5–5.1)
Sodium: 134 mmol/L — ABNORMAL LOW (ref 135–145)
Total Bilirubin: 0.7 mg/dL (ref 0.0–1.2)
Total Protein: 7.1 g/dL (ref 6.5–8.1)

## 2024-12-14 LAB — URINALYSIS, ROUTINE W REFLEX MICROSCOPIC
Bilirubin Urine: NEGATIVE
Glucose, UA: NEGATIVE mg/dL
Hgb urine dipstick: NEGATIVE
Ketones, ur: 5 mg/dL — AB
Nitrite: NEGATIVE
Protein, ur: NEGATIVE mg/dL
Specific Gravity, Urine: 1.023 (ref 1.005–1.030)
pH: 7 (ref 5.0–8.0)

## 2024-12-14 LAB — TROPONIN T, HIGH SENSITIVITY: Troponin T High Sensitivity: <15 ng/L (ref 0–19)

## 2024-12-14 LAB — PRO BRAIN NATRIURETIC PEPTIDE: Pro Brain Natriuretic Peptide: 108 pg/mL

## 2024-12-14 NOTE — MAU Note (Signed)
 Misty Ayers is a 34 y.o. at [redacted]w[redacted]d here in MAU reporting: SOB and heart pounding- did cardio on watch with sinus tach, chest pain - sharp left side under arm pit to sternum Denies VB or abd pain BS 73 this morning LMP: -  Onset of complaint: has been happening- have a cardio consult but got worse last night Pain score: 7/10 Vitals:   12/14/24 1010 12/14/24 1015  BP:  110/73  Resp:  16  Temp:  99.5 F (37.5 C)  SpO2: 100% 100%     FHT: 165  Lab orders placed from triage: EKG, ua

## 2024-12-14 NOTE — MAU Provider Note (Signed)
 " History     CSN: [id number]  Arrival date and time: 12/14/24 0955   Event Date/Time   First Provider Initiated Contact with Patient 12/14/24 1136      Chief Complaint  Patient presents with   heart palpatations   Shortness of Breath   Chest Pain   Misty Ayers , a  34 y.o. H5E8887 at [redacted]w[redacted]d presents to MAU with complaints of palpations and chest pain. Patient states that she has been having on-going palpations in this pregnancy. She reports that she became worried today after standing she felt as though she was going to pass out. She ran the EKG on her watch and was told that she was in A-fib. She had her office repeat her EKG and was told that it was still afib. She Ran it again and the last time it was sinus tachy. She also reports chest pain with movement that starts underneath her left breast and wraps around to midsternum. Reports hurts to take a deep breath. She denies SOB or numbness and tingling in her arm or hand.  She has no OB complaints.          OB History     Gravida  4   Para  2   Term  1   Preterm  1   AB  1   Living  2      SAB  1   IAB  0   Ectopic  0   Multiple  0   Live Births  2           Past Medical History:  Diagnosis Date   ADHD    Anemia    Anxiety    Depression     Past Surgical History:  Procedure Laterality Date   LEEP      Family History  Problem Relation Age of Onset   Kidney disease Mother    Stroke Mother    Hyperlipidemia Mother    Hypertension Mother    Mental illness Mother    Hypertension Father    Stroke Brother    Heart attack Maternal Grandfather     Social History[1]  Allergies: Allergies[2]  Medications Prior to Admission  Medication Sig Dispense Refill Last Dose/Taking   doxylamine, Sleep, (UNISOM) 25 MG tablet Take 25 mg by mouth at bedtime as needed.   12/13/2024   Prenatal MV-Min-Fe Fum-FA-DHA (PRENATAL 1 PO) Take by mouth.   12/13/2024   amphetamine -dextroamphetamine  (ADDERALL) 10  MG tablet Take 1 tablet (10 mg total) by mouth daily. (Patient not taking: Reported on 12/14/2024) 20 tablet 0 Not Taking   clindamycin  (CLEOCIN ) 300 MG capsule Take 1 capsule (300 mg total) by mouth 3 (three) times daily. (Patient not taking: Reported on 12/14/2024) 21 capsule 0 Not Taking   cyclobenzaprine  (FLEXERIL ) 10 MG tablet Take 1 tablet (10 mg total) by mouth 3 (three) times daily as needed for muscle spasms. (Patient not taking: Reported on 12/14/2024) 30 tablet 0 Not Taking    Review of Systems  Constitutional:  Negative for chills, fatigue and fever.  Eyes:  Negative for pain and visual disturbance.  Respiratory:  Negative for apnea, cough, choking, chest tightness, shortness of breath, wheezing and stridor.   Cardiovascular:  Positive for chest pain and palpitations. Negative for leg swelling.  Gastrointestinal:  Negative for abdominal pain, constipation, diarrhea, nausea and vomiting.  Genitourinary:  Negative for difficulty urinating, dysuria, pelvic pain, vaginal bleeding, vaginal discharge and vaginal pain.  Musculoskeletal:  Negative  for back pain.  Neurological:  Positive for light-headedness. Negative for seizures, syncope, weakness and headaches.  Psychiatric/Behavioral:  Negative for suicidal ideas.    Physical Exam   Blood pressure 110/73, temperature 99.5 F (37.5 C), temperature source Oral, resp. rate 16, height 5' 2 (1.575 m), weight 54.4 kg, SpO2 100%, not currently breastfeeding.  Physical Exam Vitals and nursing note reviewed.  Constitutional:      General: She is not in acute distress.    Appearance: Normal appearance. She is well-developed. She is not ill-appearing or diaphoretic.  HENT:     Head: Normocephalic.  Cardiovascular:     Rate and Rhythm: Regular rhythm. Tachycardia present.     Pulses: No decreased pulses.     Heart sounds: No murmur heard.    No friction rub.  Pulmonary:     Effort: Pulmonary effort is normal.     Breath sounds: Normal breath  sounds.  Chest:     Chest wall: No tenderness.  Abdominal:     Palpations: Abdomen is soft.     Tenderness: There is no abdominal tenderness.  Musculoskeletal:     Cervical back: Normal range of motion.  Skin:    General: Skin is warm and dry.  Neurological:     Mental Status: She is alert and oriented to person, place, and time.  Psychiatric:        Mood and Affect: Mood normal.    FHT obtained in triage.    MAU Course  Procedures Orders Placed This Encounter  Procedures   Urinalysis, Routine w reflex microscopic -Urine, Clean Catch   CBC with Differential/Platelet   Comprehensive metabolic panel   Pro Brain natriuretic peptide   AMB Referral to Cardio Obstetrics   ED EKG   Insert peripheral IV   Discharge patient Discharge disposition: 01-Home or Self Care; Discharge patient date: 12/14/2024   Discharge patient Discharge disposition: 01-Home or Self Care; Discharge patient date: 12/14/2024   Discharge patient Discharge disposition: 01-Home or Self Care; Discharge patient date: 12/14/2024   Results for orders placed or performed during the hospital encounter of 12/14/24 (from the past 24 hours)  Urinalysis, Routine w reflex microscopic -Urine, Clean Catch     Status: Abnormal   Collection Time: 12/14/24 10:32 AM  Result Value Ref Range   Color, Urine YELLOW YELLOW   APPearance HAZY (A) CLEAR   Specific Gravity, Urine 1.023 1.005 - 1.030   pH 7.0 5.0 - 8.0   Glucose, UA NEGATIVE NEGATIVE mg/dL   Hgb urine dipstick NEGATIVE NEGATIVE   Bilirubin Urine NEGATIVE NEGATIVE   Ketones, ur 5 (A) NEGATIVE mg/dL   Protein, ur NEGATIVE NEGATIVE mg/dL   Nitrite NEGATIVE NEGATIVE   Leukocytes,Ua SMALL (A) NEGATIVE   RBC / HPF 0-5 0 - 5 RBC/hpf   WBC, UA 0-5 0 - 5 WBC/hpf   Bacteria, UA RARE (A) NONE SEEN   Squamous Epithelial / HPF 11-20 0 - 5 /HPF   Mucus PRESENT   CBC with Differential/Platelet     Status: Abnormal   Collection Time: 12/14/24 10:48 AM  Result Value Ref Range    WBC 6.3 4.0 - 10.5 K/uL   RBC 4.07 3.87 - 5.11 MIL/uL   Hemoglobin 11.3 (L) 12.0 - 15.0 g/dL   HCT 66.4 (L) 63.9 - 53.9 %   MCV 82.3 80.0 - 100.0 fL   MCH 27.8 26.0 - 34.0 pg   MCHC 33.7 30.0 - 36.0 g/dL   RDW 87.1 88.4 - 84.4 %  Platelets 287 150 - 400 K/uL   nRBC 0.0 0.0 - 0.2 %   Neutrophils Relative % 82 %   Neutro Abs 5.2 1.7 - 7.7 K/uL   Lymphocytes Relative 11 %   Lymphs Abs 0.7 0.7 - 4.0 K/uL   Monocytes Relative 7 %   Monocytes Absolute 0.4 0.1 - 1.0 K/uL   Eosinophils Relative 0 %   Eosinophils Absolute 0.0 0.0 - 0.5 K/uL   Basophils Relative 0 %   Basophils Absolute 0.0 0.0 - 0.1 K/uL   Immature Granulocytes 0 %   Abs Immature Granulocytes 0.02 0.00 - 0.07 K/uL  Comprehensive metabolic panel     Status: Abnormal   Collection Time: 12/14/24 10:48 AM  Result Value Ref Range   Sodium 134 (L) 135 - 145 mmol/L   Potassium 3.7 3.5 - 5.1 mmol/L   Chloride 103 98 - 111 mmol/L   CO2 19 (L) 22 - 32 mmol/L   Glucose, Bld 79 70 - 99 mg/dL   BUN 9 6 - 20 mg/dL   Creatinine, Ser 9.35 0.44 - 1.00 mg/dL   Calcium 9.1 8.9 - 89.6 mg/dL   Total Protein 7.1 6.5 - 8.1 g/dL   Albumin 3.9 3.5 - 5.0 g/dL   AST 15 15 - 41 U/L   ALT 5 0 - 44 U/L   Alkaline Phosphatase 45 38 - 126 U/L   Total Bilirubin 0.7 0.0 - 1.2 mg/dL   GFR, Estimated >39 >39 mL/min   Anion gap 12 5 - 15  Pro Brain natriuretic peptide     Status: None   Collection Time: 12/14/24 10:48 AM  Result Value Ref Range   Pro Brain Natriuretic Peptide 108.0 <300.0 pg/mL  Troponin T, High Sensitivity     Status: None   Collection Time: 12/14/24 10:48 AM  Result Value Ref Range   Troponin T High Sensitivity <15 0 - 19 ng/L    MDM - EKG from triage is Sinus Tach but otherwise normal.  - Chest non-tender on palpation.  - Troponin normal. BNP and CMP normal.  - Low suspicion for heart attack or cardiomyopathy.  - UA with mild signs of dehydration.  - pain associated with deep breathing and movement, likely related to  MSK pain.  - plan for discharge.   Assessment and Plan   1. Palpitations   2. [redacted] weeks gestation of pregnancy   3. Musculoskeletal pain    - Reviewed that palpitations can be a normal discomfort of pregnancy.  - Recommended to increase water intake. Patient endorses Gatorade and liquid iv recommended to decrease to 1 time per week.  - Amb ref for [facility] cardiology.  - Reviewed worsening signs and return precautions.  - Patient discharged home in stable condition and may return to MAU as needed .   Claris CHRISTELLA Cedar, MSN CNM  12/14/2024, 11:36 AM      [1]  Social History Tobacco Use   Smoking status: Never   Smokeless tobacco: Never  Vaping Use   Vaping status: Never Used  Substance Use Topics   Alcohol use: Not Currently    Comment: 3-5   Drug use: No  [2]  Allergies Allergen Reactions   Penicillins Rash   "

## 2024-12-15 ENCOUNTER — Inpatient Hospital Stay (HOSPITAL_COMMUNITY)
Admission: AD | Admit: 2024-12-15 | Discharge: 2024-12-16 | Disposition: A | Discharge status: Home / Self Care | Attending: Obstetrics & Gynecology | Admitting: Obstetrics & Gynecology

## 2024-12-15 ENCOUNTER — Encounter (HOSPITAL_COMMUNITY): Payer: Self-pay | Admitting: Obstetrics & Gynecology

## 2024-12-15 DIAGNOSIS — O99891 Other specified diseases and conditions complicating pregnancy: Secondary | ICD-10-CM | POA: Insufficient documentation

## 2024-12-15 DIAGNOSIS — O219 Vomiting of pregnancy, unspecified: Secondary | ICD-10-CM | POA: Diagnosis not present

## 2024-12-15 DIAGNOSIS — R002 Palpitations: Secondary | ICD-10-CM | POA: Diagnosis not present

## 2024-12-15 DIAGNOSIS — R5383 Other fatigue: Secondary | ICD-10-CM | POA: Diagnosis present

## 2024-12-15 DIAGNOSIS — Z3A15 15 weeks gestation of pregnancy: Secondary | ICD-10-CM | POA: Diagnosis not present

## 2024-12-15 LAB — URINALYSIS, ROUTINE W REFLEX MICROSCOPIC
Bilirubin Urine: NEGATIVE
Glucose, UA: NEGATIVE mg/dL
Hgb urine dipstick: NEGATIVE
Ketones, ur: 15 mg/dL — AB
Leukocytes,Ua: NEGATIVE
Nitrite: NEGATIVE
Protein, ur: NEGATIVE mg/dL
Specific Gravity, Urine: >1.03 — ABNORMAL HIGH (ref 1.005–1.030)
pH: 6 (ref 5.0–8.0)

## 2024-12-15 MED ORDER — PROMETHAZINE HCL 25 MG PO TABS
25.0000 mg | ORAL_TABLET | Freq: Once | ORAL | Status: AC
  Administered 2024-12-15: 25 mg via ORAL
  Filled 2024-12-15: qty 1

## 2024-12-15 NOTE — MAU Provider Note (Signed)
 " History     CSN: [id number]  Arrival date and time: 12/15/24 2130   Event Date/Time   First Provider Initiated Contact with Patient 12/15/24 2311      Chief Complaint  Patient presents with   Fatigue   Misty Ayers is a 34 y.o. H5E8887 at [redacted]w[redacted]d who receives care at [facility].  She states her next appt is early February.   She presents today for vomiting.  She states she was seen yesterday and has been experiencing vomiting since noon.  She reports she has tried Zofran  with small sips, crackers, and small meals with no relief.  She states she had vomiting ~ 1 hr prior to arrival.  She reports taking 2 tablets of Zofran  prior to arrival, but is still experiencing nausea. She states she has also tried 1/2 tablet of Unisom and Vitamin B with vomiting shortly after.   Patient reports feeling like she is having heart palpitations, but was experiencing that yesterday and received referral to cardiology.  However, she states the symptoms seem worse since she has been unable to eat.   OB History     Gravida  4   Para  2   Term  1   Preterm  1   AB  1   Living  2      SAB  1   IAB  0   Ectopic  0   Multiple  0   Live Births  2           Past Medical History:  Diagnosis Date   ADHD    Anemia    Anxiety    Depression     Past Surgical History:  Procedure Laterality Date   LEEP      Family History  Problem Relation Age of Onset   Kidney disease Mother    Stroke Mother    Hyperlipidemia Mother    Hypertension Mother    Mental illness Mother    Hypertension Father    Stroke Brother    Heart attack Maternal Grandfather     Social History[1]  Allergies: Allergies[2]  Medications Prior to Admission  Medication Sig Dispense Refill Last Dose/Taking   amphetamine -dextroamphetamine  (ADDERALL) 10 MG tablet Take 1 tablet (10 mg total) by mouth daily. (Patient not taking: Reported on 12/14/2024) 20 tablet 0    clindamycin  (CLEOCIN ) 300 MG capsule  Take 1 capsule (300 mg total) by mouth 3 (three) times daily. (Patient not taking: Reported on 12/14/2024) 21 capsule 0    cyclobenzaprine  (FLEXERIL ) 10 MG tablet Take 1 tablet (10 mg total) by mouth 3 (three) times daily as needed for muscle spasms. (Patient not taking: Reported on 12/14/2024) 30 tablet 0    doxylamine, Sleep, (UNISOM) 25 MG tablet Take 25 mg by mouth at bedtime as needed.      Prenatal MV-Min-Fe Fum-FA-DHA (PRENATAL 1 PO) Take by mouth.       Review of Systems  Constitutional:  Positive for chills. Negative for fever.  Gastrointestinal:  Positive for nausea and vomiting. Negative for constipation and diarrhea.  Genitourinary:  Negative for difficulty urinating, dysuria, vaginal bleeding and vaginal discharge.  Musculoskeletal:  Positive for back pain.  Neurological:  Positive for dizziness and light-headedness. Negative for headaches.   Physical Exam   Blood pressure 97/60, pulse (!) 101, temperature 98.8 F (37.1 C), resp. rate 17, height 5' 2.5 (1.588 m), weight 54 kg, SpO2 100%, not currently breastfeeding.  Physical Exam Vitals reviewed.  Constitutional:  General: She is not in acute distress.    Appearance: Normal appearance.  HENT:     Head: Normocephalic and atraumatic.  Eyes:     Conjunctiva/sclera: Conjunctivae normal.  Pulmonary:     Effort: Pulmonary effort is normal.  Musculoskeletal:        General: Normal range of motion.     Cervical back: Normal range of motion.  Neurological:     Mental Status: She is alert and oriented to person, place, and time.  Psychiatric:        Attention and Perception: Attention normal.        Mood and Affect: Mood is anxious.        Speech: Speech normal.     MAU Course  Procedures Results for orders placed or performed during the hospital encounter of 12/15/24 (from the past 24 hours)  Urinalysis, Routine w reflex microscopic -Urine, Clean Catch     Status: Abnormal   Collection Time: 12/15/24 10:20 PM   Result Value Ref Range   Color, Urine YELLOW YELLOW   APPearance HAZY (A) CLEAR   Specific Gravity, Urine >1.030 (H) 1.005 - 1.030   pH 6.0 5.0 - 8.0   Glucose, UA NEGATIVE NEGATIVE mg/dL   Hgb urine dipstick NEGATIVE NEGATIVE   Bilirubin Urine NEGATIVE NEGATIVE   Ketones, ur 15 (A) NEGATIVE mg/dL   Protein, ur NEGATIVE NEGATIVE mg/dL   Nitrite NEGATIVE NEGATIVE   Leukocytes,Ua NEGATIVE NEGATIVE    MDM Antiemetics Labs: UA EKG Prescription Assessment and Plan  34 year old, H5E8887  SIUP at 15.5 weeks N/V Heart Palpitations  -Reviewed POC with patient. -Exam performed.  -EKG ordered and returns normal sinus rhythm.  -UA returns without significant signs of dehydration. Patient informed and agreeable to oral antiemetics.  -Will give phenergan  and reassess.   Harlene LITTIE Duncans 12/15/2024, 11:11 PM    Reassessment (12:52 AM) -Patient reports some dry-heaving after drinking water early, but none since eating crackers and drinking ginger ale.  -Educated on food intake that is supportive of nausea/vomiting.  Suggest substituting water for carbonated beverages and electrolyte drinks until symptoms resolve.  -Discussed giving additional medications and patient agreeable. -Will give dose of reglan  and reassess.  Reassessment (1:40 AM) -Patient reports improvement in symptoms. -Discussed sending medication to pharmacy and patient agreeable. -Rx for phenergan  and reglan  sent. -Nurse to give precautions. -Discharged to home in improved condition.  Harlene LITTIE Duncans MSN, CNM Advanced Practice Provider, [facility] [employer] [facility]      [1]  Social History Tobacco Use   Smoking status: Never   Smokeless tobacco: Never  Vaping Use   Vaping status: Never Used  Substance Use Topics   Alcohol use: Not Currently    Comment: 3-5   Drug use: No  [2]  Allergies Allergen Reactions   Penicillins Rash   "

## 2024-12-15 NOTE — MAU Note (Addendum)
 Lonnie K Bulls is a 34 y.o. at [redacted]w[redacted]d here in MAU reporting n/v since pt was seen here yesterday. Provider called in Zofran  and it is not helping. States her heart rate at home was 120-130 and thinks she is dehydrated. States her stomach is sore and her lower back hurts. Denies VB. Pt states she had one watery BM today. States home flu/covid test yesterday was negative  LMP: na Onset of complaint: yesterday afternoon Pain score: 8 Vitals:   12/15/24 2156 12/15/24 2159  BP:  97/60  Pulse: (!) 101   Resp: 17   Temp: 98.8 F (37.1 C)   SpO2: 100%      FHT: 163  Lab orders placed from triage: ua

## 2024-12-16 DIAGNOSIS — O219 Vomiting of pregnancy, unspecified: Secondary | ICD-10-CM | POA: Diagnosis not present

## 2024-12-16 DIAGNOSIS — Z3A15 15 weeks gestation of pregnancy: Secondary | ICD-10-CM | POA: Diagnosis not present

## 2024-12-16 MED ORDER — METOCLOPRAMIDE HCL 10 MG PO TABS: 10.0000 mg | ORAL_TABLET | Freq: Four times a day (QID) | ORAL | 0 refills | Status: AC

## 2024-12-16 MED ORDER — PROMETHAZINE HCL 25 MG PO TABS: 25.0000 mg | ORAL_TABLET | Freq: Four times a day (QID) | ORAL | 0 refills | Status: AC | PRN

## 2024-12-16 MED ORDER — METOCLOPRAMIDE HCL 10 MG PO TABS
10.0000 mg | ORAL_TABLET | Freq: Once | ORAL | Status: AC
  Administered 2024-12-16: 10 mg via ORAL
  Filled 2024-12-16: qty 1

## 2024-12-16 NOTE — Progress Notes (Signed)
 Written and verbal d/c instructions given and pt voiced understanding.

## 2024-12-19 ENCOUNTER — Ambulatory Visit: Admitting: Family Medicine

## 2025-01-17 ENCOUNTER — Ambulatory Visit: Admitting: Cardiology
